# Patient Record
Sex: Male | Born: 1970 | Race: White | Hispanic: No | Smoking: Current every day smoker
Health system: Southern US, Community
[De-identification: ages and names within clinical notes are randomized; demographics above are authoritative.]

## PROBLEM LIST (undated history)

## (undated) DIAGNOSIS — C4491 Basal cell carcinoma of skin, unspecified: Secondary | ICD-10-CM

---

## 2006-04-21 ENCOUNTER — Emergency Department: Payer: Self-pay | Admitting: Emergency Medicine

## 2007-06-03 ENCOUNTER — Emergency Department: Payer: Self-pay | Admitting: Emergency Medicine

## 2007-06-14 ENCOUNTER — Emergency Department: Payer: Self-pay | Admitting: Emergency Medicine

## 2007-06-17 ENCOUNTER — Emergency Department: Payer: Self-pay | Admitting: Emergency Medicine

## 2008-12-08 ENCOUNTER — Emergency Department: Payer: Self-pay

## 2010-10-26 ENCOUNTER — Emergency Department: Payer: Self-pay | Admitting: Emergency Medicine

## 2011-02-25 ENCOUNTER — Emergency Department: Payer: Self-pay

## 2012-07-24 ENCOUNTER — Emergency Department: Payer: Self-pay | Admitting: Emergency Medicine

## 2012-07-24 LAB — DRUG SCREEN, URINE
Amphetamines, Ur Screen: NEGATIVE (ref ?–1000)
Barbiturates, Ur Screen: NEGATIVE (ref ?–200)
Benzodiazepine, Ur Scrn: NEGATIVE (ref ?–200)
Cannabinoid 50 Ng, Ur ~~LOC~~: NEGATIVE (ref ?–50)
Methadone, Ur Screen: NEGATIVE (ref ?–300)
Opiate, Ur Screen: NEGATIVE (ref ?–300)
Tricyclic, Ur Screen: NEGATIVE (ref ?–1000)

## 2012-07-24 LAB — COMPREHENSIVE METABOLIC PANEL
Albumin: 4.3 g/dL (ref 3.4–5.0)
Alkaline Phosphatase: 108 U/L (ref 50–136)
BUN: 15 mg/dL (ref 7–18)
Calcium, Total: 8.7 mg/dL (ref 8.5–10.1)
Co2: 28 mmol/L (ref 21–32)
EGFR (Non-African Amer.): >60
Glucose: 80 mg/dL (ref 65–99)
Osmolality: 275 (ref 275–301)
SGOT(AST): 49 U/L — ABNORMAL HIGH (ref 15–37)
SGPT (ALT): 34 U/L (ref 12–78)
Sodium: 138 mmol/L (ref 136–145)

## 2012-07-24 LAB — CBC
HCT: 39.4 % — ABNORMAL LOW (ref 40.0–52.0)
HGB: 13.5 g/dL (ref 13.0–18.0)
MCH: 30.5 pg (ref 26.0–34.0)
MCHC: 34.3 g/dL (ref 32.0–36.0)
MCV: 89 fL (ref 80–100)
Platelet: 340 10*3/uL (ref 150–440)
RDW: 13.3 % (ref 11.5–14.5)
WBC: 10.4 10*3/uL (ref 3.8–10.6)

## 2012-07-24 LAB — URINALYSIS, COMPLETE
Bacteria: NONE SEEN
Bilirubin,UR: NEGATIVE
Ketone: NEGATIVE
Nitrite: NEGATIVE
Protein: NEGATIVE
RBC,UR: 1 /HPF (ref 0–5)
Specific Gravity: 1.002 (ref 1.003–1.030)
Squamous Epithelial: NONE SEEN
WBC UR: 2 /HPF (ref 0–5)

## 2012-07-24 LAB — ETHANOL
Ethanol %: 0.014 % (ref 0.000–0.080)
Ethanol: 14 mg/dL

## 2013-04-14 ENCOUNTER — Emergency Department: Payer: Self-pay

## 2013-04-14 LAB — BASIC METABOLIC PANEL
Anion Gap: 5 — ABNORMAL LOW (ref 7–16)
Co2: 29 mmol/L (ref 21–32)
Creatinine: 0.75 mg/dL (ref 0.60–1.30)
EGFR (African American): >60
EGFR (Non-African Amer.): >60
Glucose: 91 mg/dL (ref 65–99)
Osmolality: 275 (ref 275–301)
Potassium: 3.9 mmol/L (ref 3.5–5.1)

## 2013-04-14 LAB — TROPONIN I
Troponin-I: <0.02 ng/mL
Troponin-I: <0.02 ng/mL

## 2013-04-14 LAB — CBC
HCT: 38 % — ABNORMAL LOW (ref 40.0–52.0)
HGB: 13.3 g/dL (ref 13.0–18.0)
MCHC: 34.9 g/dL (ref 32.0–36.0)
RBC: 4.32 10*6/uL — ABNORMAL LOW (ref 4.40–5.90)
RDW: 13 % (ref 11.5–14.5)
WBC: 9.6 10*3/uL (ref 3.8–10.6)

## 2014-12-25 ENCOUNTER — Emergency Department
Admit: 2014-12-25 | Disposition: A | Discharge status: Home / Self Care | Payer: Self-pay | Admitting: Emergency Medicine

## 2014-12-25 LAB — COMPREHENSIVE METABOLIC PANEL
ALK PHOS: 64 U/L
ANION GAP: 6 — AB (ref 7–16)
Albumin: 4.2 g/dL
BILIRUBIN TOTAL: 0.3 mg/dL
BUN: 14 mg/dL
CHLORIDE: 101 mmol/L
Calcium, Total: 8.8 mg/dL — ABNORMAL LOW
Co2: 31 mmol/L
Creatinine: 1.2 mg/dL
EGFR (Non-African Amer.): >60
GLUCOSE: 91 mg/dL
POTASSIUM: 3.8 mmol/L
SGOT(AST): 26 U/L
SGPT (ALT): 21 U/L
Sodium: 138 mmol/L
TOTAL PROTEIN: 6.5 g/dL

## 2014-12-25 LAB — CBC WITH DIFFERENTIAL/PLATELET
BASOS PCT: 0.7 %
Basophil #: 0.1 10*3/uL (ref 0.0–0.1)
Eosinophil #: 0.5 10*3/uL (ref 0.0–0.7)
Eosinophil %: 4.4 %
HCT: 41.5 % (ref 40.0–52.0)
HGB: 13.4 g/dL (ref 13.0–18.0)
LYMPHS PCT: 39.5 %
Lymphocyte #: 4.1 10*3/uL — ABNORMAL HIGH (ref 1.0–3.6)
MCH: 29.8 pg (ref 26.0–34.0)
MCHC: 32.3 g/dL (ref 32.0–36.0)
MCV: 92 fL (ref 80–100)
MONOS PCT: 7.9 %
Monocyte #: 0.8 x10 3/mm (ref 0.2–1.0)
Neutrophil #: 4.9 10*3/uL (ref 1.4–6.5)
Neutrophil %: 47.5 %
Platelet: 324 10*3/uL (ref 150–440)
RBC: 4.5 10*6/uL (ref 4.40–5.90)
RDW: 14.2 % (ref 11.5–14.5)
WBC: 10.4 10*3/uL (ref 3.8–10.6)

## 2014-12-25 LAB — LIPASE, BLOOD: LIPASE: 55 U/L — AB

## 2014-12-25 LAB — TROPONIN I

## 2014-12-26 LAB — TROPONIN I: Troponin-I: <0.03 ng/mL

## 2015-03-23 ENCOUNTER — Ambulatory Visit: Payer: Self-pay | Admitting: Internal Medicine

## 2015-05-26 ENCOUNTER — Other Ambulatory Visit: Payer: Self-pay | Admitting: Internal Medicine

## 2015-05-26 DIAGNOSIS — R918 Other nonspecific abnormal finding of lung field: Secondary | ICD-10-CM

## 2015-06-22 ENCOUNTER — Encounter: Payer: Self-pay | Admitting: Emergency Medicine

## 2015-06-22 ENCOUNTER — Emergency Department
Admission: EM | Admit: 2015-06-22 | Discharge: 2015-06-23 | Disposition: A | Discharge status: Home / Self Care | Payer: No Typology Code available for payment source | Attending: Emergency Medicine | Admitting: Emergency Medicine

## 2015-06-22 DIAGNOSIS — F1310 Sedative, hypnotic or anxiolytic abuse, uncomplicated: Secondary | ICD-10-CM | POA: Insufficient documentation

## 2015-06-22 DIAGNOSIS — F1210 Cannabis abuse, uncomplicated: Secondary | ICD-10-CM | POA: Insufficient documentation

## 2015-06-22 DIAGNOSIS — F1110 Opioid abuse, uncomplicated: Secondary | ICD-10-CM | POA: Diagnosis not present

## 2015-06-22 DIAGNOSIS — Z720 Tobacco use: Secondary | ICD-10-CM | POA: Diagnosis not present

## 2015-06-22 DIAGNOSIS — F1410 Cocaine abuse, uncomplicated: Secondary | ICD-10-CM | POA: Diagnosis present

## 2015-06-22 DIAGNOSIS — F1910 Other psychoactive substance abuse, uncomplicated: Secondary | ICD-10-CM

## 2015-06-22 LAB — URINE DRUG SCREEN, QUALITATIVE (ARMC ONLY)
Amphetamines, Ur Screen: NOT DETECTED
BARBITURATES, UR SCREEN: NOT DETECTED
BENZODIAZEPINE, UR SCRN: POSITIVE — AB
Cannabinoid 50 Ng, Ur ~~LOC~~: POSITIVE — AB
Cocaine Metabolite,Ur ~~LOC~~: POSITIVE — AB
MDMA (Ecstasy)Ur Screen: NOT DETECTED
METHADONE SCREEN, URINE: NOT DETECTED
Opiate, Ur Screen: POSITIVE — AB
Phencyclidine (PCP) Ur S: NOT DETECTED
Tricyclic, Ur Screen: NOT DETECTED

## 2015-06-22 LAB — COMPREHENSIVE METABOLIC PANEL
ALT: 24 U/L (ref 17–63)
AST: 34 U/L (ref 15–41)
Albumin: 3.9 g/dL (ref 3.5–5.0)
Alkaline Phosphatase: 78 U/L (ref 38–126)
Anion gap: 5 (ref 5–15)
BUN: 13 mg/dL (ref 6–20)
CHLORIDE: 103 mmol/L (ref 101–111)
CO2: 31 mmol/L (ref 22–32)
Calcium: 9.3 mg/dL (ref 8.9–10.3)
Creatinine, Ser: 0.84 mg/dL (ref 0.61–1.24)
GFR calc Af Amer: >60 mL/min (ref >60)
GFR calc non Af Amer: >60 mL/min (ref >60)
Glucose, Bld: 81 mg/dL (ref 65–99)
Potassium: 4 mmol/L (ref 3.5–5.1)
Sodium: 139 mmol/L (ref 135–145)
TOTAL PROTEIN: 6.4 g/dL — AB (ref 6.5–8.1)
Total Bilirubin: 0.8 mg/dL (ref 0.3–1.2)

## 2015-06-22 LAB — CBC WITH DIFFERENTIAL/PLATELET
BASOS ABS: 0.1 10*3/uL (ref 0–0.1)
BASOS PCT: 1 %
Eosinophils Absolute: 0.2 10*3/uL (ref 0–0.7)
Eosinophils Relative: 3 %
HEMATOCRIT: 40 % (ref 40.0–52.0)
Hemoglobin: 13.2 g/dL (ref 13.0–18.0)
Lymphocytes Relative: 29 %
Lymphs Abs: 2.3 10*3/uL (ref 1.0–3.6)
MCH: 29.9 pg (ref 26.0–34.0)
MCHC: 33 g/dL (ref 32.0–36.0)
MCV: 90.7 fL (ref 80.0–100.0)
MONO ABS: 1 10*3/uL (ref 0.2–1.0)
MONOS PCT: 13 %
NEUTROS ABS: 4.3 10*3/uL (ref 1.4–6.5)
Neutrophils Relative %: 54 %
PLATELETS: 401 10*3/uL (ref 150–440)
RBC: 4.41 MIL/uL (ref 4.40–5.90)
RDW: 13.7 % (ref 11.5–14.5)
WBC: 7.8 10*3/uL (ref 3.8–10.6)

## 2015-06-22 LAB — BLOOD GAS, ARTERIAL
Acid-Base Excess: 4.7 mmol/L — ABNORMAL HIGH (ref 0.0–3.0)
Allens test (pass/fail): POSITIVE — AB
BICARBONATE: 30.9 meq/L — AB (ref 21.0–28.0)
FIO2: 0.21
O2 SAT: 93.4 %
PATIENT TEMPERATURE: 37
pCO2 arterial: 51 mmHg — ABNORMAL HIGH (ref 32.0–48.0)
pH, Arterial: 7.39 (ref 7.350–7.450)
pO2, Arterial: 69 mmHg — ABNORMAL LOW (ref 83.0–108.0)

## 2015-06-22 LAB — ETHANOL

## 2015-06-22 LAB — ACETAMINOPHEN LEVEL

## 2015-06-22 LAB — SALICYLATE LEVEL: Salicylate Lvl: <4 mg/dL (ref 2.8–30.0)

## 2015-06-22 MED ORDER — SODIUM CHLORIDE 0.9 % IV SOLN
1000.0000 mL | INTRAVENOUS | Status: DC
Start: 2015-06-22 — End: 2015-06-23
  Administered 2015-06-22: 1000 mL via INTRAVENOUS

## 2015-06-22 MED ORDER — SODIUM CHLORIDE 0.9 % IV SOLN
1000.0000 mL | Freq: Once | INTRAVENOUS | Status: AC
  Administered 2015-06-22: 1000 mL via INTRAVENOUS

## 2015-06-22 NOTE — ED Notes (Signed)
During I&O cath, pt kicked and most of specimen spilled.  Per MD, UDS takes priority. Specimen walked to lab and told to run UDS first then UA if possible.

## 2015-06-22 NOTE — ED Notes (Signed)
Pt still lethargic, only responding to pain.  No obvious signs of pain noted.  Respirations equal and unlabored, skin warm and dry.

## 2015-06-22 NOTE — ED Notes (Signed)
Pt to ed from EMS under police custody, pt possible took multiple times of unknown medications. Per pt all her took was ativan. Cops states possible cocaine. Pt is alert to pain, if you try to move his arms he resists.

## 2015-06-23 NOTE — ED Provider Notes (Signed)
[facility] Emergency Department Provider Note  ____________________________________________  Time seen: Approximately 1:03 AM  I have reviewed the triage vital signs and the nursing notes.   HISTORY  Chief Complaint Drug Overdose  History limited by somnolence  HPI Edward Hammond is a 44 y.o. male brought to the ED by [facility] police for medical clearance. Police stopped patient for driving under the influence; patient told triage nurse all he took was Ativan. Police suspect possible cocaine as well. Denies trauma. History otherwise unobtainable secondary to patient uncooperative.   History reviewed. No pertinent past medical history.  There are no active problems to display for this patient.   History reviewed. No pertinent past surgical history.  No current outpatient prescriptions on file.  Allergies Review of patient's allergies indicates no known allergies.  History reviewed. No pertinent family history.  Social History Social History  Substance Use Topics  . Smoking status: Current Every Day Smoker  . Smokeless tobacco: None  . Alcohol Use: No    Review of Systems Constitutional: No fever/chills Eyes: No visual changes. ENT: No sore throat. Cardiovascular: Denies chest pain. Respiratory: Denies shortness of breath. Gastrointestinal: No abdominal pain.  No nausea, no vomiting.  No diarrhea.  No constipation. Genitourinary: Negative for dysuria. Musculoskeletal: Negative for back pain. Skin: Negative for rash. Neurological: Negative for headaches, focal weakness or numbness. Psychiatric:Denies intentional ingestion.  Limited by somnolence;10-point ROS otherwise negative.  ____________________________________________   PHYSICAL EXAM:  VITAL SIGNS: ED Triage Vitals  Enc Vitals Group     BP 06/22/15 2154 107/82 mmHg     Pulse Rate 06/22/15 2154 73     Resp 06/22/15 2154 16     Temp 06/22/15 2154 97.5 F (36.4 C)      Temp Source 06/22/15 2154 Oral     SpO2 06/22/15 2154 100 %     Weight 06/22/15 2154 190 lb (86.183 kg)     Height 06/22/15 2154 6\' 2"  (1.88 m)     Head Cir --      Peak Flow --      Pain Score 06/22/15 2303 Asleep     Pain Loc --      Pain Edu? --      Excl. in [place]? --     Constitutional: Somnolent but arousable to voice. Disheveled and in no acute distress. Eyes: Conjunctivae are bloodshot bilaterally. PERRL. EOMI. Head: Atraumatic. Nose: No congestion/rhinnorhea. Mouth/Throat: Mucous membranes are moist.  Oropharynx non-erythematous. Neck: No stridor. No cervical spine tenderness to palpation. Cardiovascular: Normal rate, regular rhythm. Grossly normal heart sounds.  Good peripheral circulation. Respiratory: Normal respiratory effort.  No retractions. Lungs CTAB. Gastrointestinal: Soft and nontender. No distention. No abdominal bruits. No CVA tenderness. Musculoskeletal: No lower extremity tenderness nor edema.  No joint effusions. Neurologic:  Normal speech and language. No gross focal neurologic deficits are appreciated. Moves all extremities 4. Skin:  Skin is warm, dry and intact. No rash noted. Psychiatric: Mood and affect are normal. Speech and behavior are normal.  ____________________________________________   LABS (all labs ordered are listed, but only abnormal results are displayed)  Labs Reviewed  ACETAMINOPHEN LEVEL - Abnormal; Notable for the following:    Acetaminophen (Tylenol), Serum <10 (*)    All other components within normal limits  COMPREHENSIVE METABOLIC PANEL - Abnormal; Notable for the following:    Total Protein 6.4 (*)    All other components within normal limits  URINE DRUG SCREEN, QUALITATIVE ([facility] ONLY) - Abnormal; Notable for the  following:    Cocaine Metabolite,Ur [place] POSITIVE (*)    Opiate, Ur Screen POSITIVE (*)    Cannabinoid 50 Ng, Ur [employer] POSITIVE (*)    Benzodiazepine, Ur Scrn POSITIVE (*)    All other components within normal limits   BLOOD GAS, ARTERIAL - Abnormal; Notable for the following:    pCO2 arterial 51 (*)    pO2, Arterial 69 (*)    Bicarbonate 30.9 (*)    Acid-Base Excess 4.7 (*)    Allens test (pass/fail) POSITIVE (*)    All other components within normal limits  CBC WITH DIFFERENTIAL/PLATELET  ETHANOL  SALICYLATE LEVEL  URINALYSIS COMPLETEWITH MICROSCOPIC ([facility] ONLY)   ____________________________________________  EKG  ED ECG REPORT I, Patrese Neal J, the attending physician, personally viewed and interpreted this ECG.   Date: 06/23/2015  EKG Time: 2221  Rate: 80  Rhythm: normal EKG, normal sinus rhythm  Axis: Normal  Intervals:none  ST&T Change: Nonspecific  ____________________________________________  RADIOLOGY  None ____________________________________________   PROCEDURES  Procedure(s) performed: None  Critical Care performed: No  ____________________________________________   INITIAL IMPRESSION / ASSESSMENT AND PLAN / ED COURSE  Pertinent labs & imaging results that were available during my care of the patient were reviewed by me and considered in my medical decision making (see chart for details).  44 year old male brought under police custody for possible substance ingestion. Laboratory and urinalysis results notable for positive cocaine, opiates, cannabinoids and benzodiazepines. Will initiate IV fluid resuscitation. Patient denies intentional ingestion. Charcoal not medically necessary as we do not know time of patient's ingestion. Will continue to observe patient on the monitor.  ----------------------------------------- 6:10 AM on 06/23/2015 -----------------------------------------  Patient is now awake and ambulatory with steady gait. He will be discharged with the police officer to jail. Strict return precautions given. Patient verbalizes understanding and agrees with plan of care. ____________________________________________   FINAL CLINICAL IMPRESSION(S) / ED  DIAGNOSES  Final diagnoses:  Polysubstance abuse  Cocaine abuse  Marijuana abuse      Paulette Blanch, MD 06/23/15 [phone]

## 2015-06-23 NOTE — ED Notes (Signed)
Pt awake, alert, and oriented.  MD informed

## 2015-06-23 NOTE — Discharge Instructions (Signed)
1. Stop using illicit drugs. 2. Return to the ER for worsening symptoms, persistent vomiting, lethargy or other concerns.  Polysubstance Abuse When people abuse more than one drug or type of drug it is called polysubstance or polydrug abuse. For example, many smokers also drink alcohol. This is one form of polydrug abuse. Polydrug abuse also refers to the use of a drug to counteract an unpleasant effect produced by another drug. It may also be used to help with withdrawal from another drug. People who take stimulants may become agitated. Sometimes this agitation is countered with a tranquilizer. This helps protect against the unpleasant side effects. Polydrug abuse also refers to the use of different drugs at the same time.  Anytime drug use is interfering with normal living activities, it has become abuse. This includes problems with family and friends. Psychological dependence has developed when your mind tells you that the drug is needed. This is usually followed by physical dependence which has developed when continuing increases of drug are required to get the same feeling or "high". This is known as addiction or chemical dependency. A person's risk is much higher if there is a history of chemical dependency in the family. SIGNS OF CHEMICAL DEPENDENCY  You have been told by friends or family that drugs have become a problem.  You fight when using drugs.  You are having blackouts (not remembering what you do while using).  You feel sick from using drugs but continue using.  You lie about use or amounts of drugs (chemicals) used.  You need chemicals to get you going.  You are suffering in work performance or in school because of drug use.  You get sick from use of drugs but continue to use anyway.  You need drugs to relate to people or feel comfortable in social situations.  You use drugs to forget problems. "Yes" answered to any of the above signs of chemical dependency indicates there  are problems. The longer the use of drugs continues, the greater the problems will become. If there is a family history of drug or alcohol use, it is best not to experiment with these drugs. Continual use leads to tolerance. After tolerance develops more of the drug is needed to get the same feeling. This is followed by addiction. With addiction, drugs become the most important part of life. It becomes more important to take drugs than participate in the other usual activities of life. This includes relating to friends and family. Addiction is followed by dependency. Dependency is a condition where drugs are now needed not just to get high, but to feel normal. Addiction cannot be cured but it can be stopped. This often requires outside help and the care of professionals. Treatment centers are listed in the yellow pages under: Cocaine, Narcotics, and Alcoholics Anonymous. Most hospitals and clinics can refer you to a specialized care center. Talk to your caregiver if you need help.   This information is not intended to replace advice given to you by your health care provider. Make sure you discuss any questions you have with your health care provider.   Document Released: 04/17/2005 Document Revised: 11/18/2011 Document Reviewed: 08/31/2014 Elsevier Interactive Patient Education 2016 [employer].  Cannabis Use Disorder Cannabis use disorder is a mental disorder. It is not one-time or occasional use of cannabis, more commonly known as marijuana. Cannabis use disorder is the continued, nonmedical use of cannabis that interferes with normal life activities or causes health problems. People with cannabis use  disorder get a feeling of extreme pleasure and relaxation from cannabis use. This "high" is very rewarding and causes people to use over and over.  The mind-altering ingredient in cannabis is know as THC. THC can also interfere with motor coordination, memory, judgment, and accurate sense of space and  time. These effects can last for a few days after using cannabis. Regular heavy cannabis use can cause long-lasting problems with thinking and learning. In young people, these problems may be permanent. Cannabis sometimes causes severe anxiety, paranoia, or visual hallucinations. Man-made (synthetic) cannabis-like drugs, such as "spice" and "K2," cause the same effects as THC but are much stronger. Cannabis-like drugs can cause dangerously high blood pressure and heart rate.  Cannabis use disorder usually starts in the teenage years. It can trigger the development of schizophrenia. It is somewhat more common in men than women. People who have family members with the disorder or existing mental health issues such as depression and posttraumatic stress disorderare more likely to develop cannabis use disorder. People with cannabis use disorder are at higher risk for use of other drugs of abuse.  SIGNS AND SYMPTOMS Signs and symptoms of cannabis use disorder include:   Use of cannabis in larger amounts or over a longer period than intended.   Unsuccessful attempts to cut down or control cannabis use.   A lot of time spent obtaining, using, or recovering from the effects of cannabis.   A strong desire or urge to use cannabis (cravings).   Continued use of cannabis in spite of problems at work, school, or home because of use.   Continued use of cannabis in spite of relationship problems because of use.  Giving up or cutting down on important life activities because of cannabis use.  Use of cannabis over and over even in situations when it is physically hazardous, such as when driving a car.   Continued use of cannabis in spite of a physical problem that is likely related to use. Physical problems can include:  Chronic cough.  Bronchitis.  Emphysema.  Throat and lung cancer.  Continued use of cannabis in spite of a mental problem that is likely related to use. Mental problems can  include:  Psychosis.  Anxiety.  Difficulty sleeping.  Need to use more and more cannabis to get the same effect, or lessened effect over time with use of the same amount (tolerance).  Having withdrawal symptoms when cannabis use is stopped, or using cannabis to reduce or avoid withdrawal symptoms. Withdrawal symptoms include:  Irritability or anger.  Anxiety or restlessness.  Difficulty sleeping.  Loss of appetite or weight.  Aches and pains.  Shakiness.  Sweating.  Chills. DIAGNOSIS Cannabis use disorder is diagnosed by your health care provider. You may be asked questions about your cannabis use and how it affects your life. A physical exam may be done. A drug screen may be done. You may be referred to a mental health professional. The diagnosis of cannabis use disorder requires at least two symptoms within 12 months. The type of cannabis use disorder you have depends on the number of symptoms you have. The type may be:  Mild. Two or three signs and symptoms.   Moderate. Four or five signs and symptoms.   Severe. Six or more signs and symptoms.  TREATMENT Treatment is usually provided by mental health professionals with training in substance use disorders. The following options are available:  Counseling or talk therapy. Talk therapy addresses the reasons you use cannabis.  It also addresses ways to keep you from using again. The goals of talk therapy include:  Identifying and avoiding triggers for use.  Learning how to handle cravings.  Replacing use with healthy activities.  Support groups. Support groups provide emotional support, advice, and guidance.  Medicine. Medicine is used to treat mental health issues that trigger cannabis use or that result from it. HOME CARE INSTRUCTIONS  Take medicines only as directed by your health care provider.  Check with your health care provider before starting any new medicines.  Keep all follow-up visits as directed by  your health care provider. SEEK MEDICAL CARE IF:  You are not able to take your medicines as directed.  Your symptoms get worse. SEEK IMMEDIATE MEDICAL CARE IF: You have serious thoughts about hurting yourself or others. [place] on Drug Abuse: motorcyclefax.com  Substance Abuse and Mental Health Services Administration: ktimeonline.com   This information is not intended to replace advice given to you by your health care provider. Make sure you discuss any questions you have with your health care provider.   Document Released: 08/23/2000 Document Revised: 09/16/2014 Document Reviewed: 09/08/2013 Elsevier Interactive Patient Education 2016 [employer].  Stimulant Use Disorder-Cocaine Cocaine is one of a group of powerful drugs called stimulants. Cocaine has medical uses for stopping nosebleeds and for pain control before minor nose or dental surgery. However, cocaine is misused because of the effects that it produces. These effects include:   A feeling of extreme pleasure.  Alertness.  High energy. Common street names for cocaine include coke, crack, blow, snow, and nose candy. Cocaine is snorted, dissolved in water and injected, or smoked.  Stimulants are addictive because they activate regions of the brain that produce both the pleasurable sensation of "reward" and psychological dependence. Together, these actions account for loss of control and the rapid development of drug dependence. This means you become ill without the drug (withdrawal) and need to keep using it to function.  Stimulant use disorder is use of stimulants that disrupts your daily life. It disrupts relationships with family and friends and how you do your job. Cocaine increases your blood pressure and heart rate. It can cause a heart attack or stroke. Cocaine can also cause death from irregular heart rate or seizures. SYMPTOMS Symptoms of stimulant use disorder with cocaine  include:  Use of cocaine in larger amounts or over a longer period of time than intended.  Unsuccessful attempts to cut down or control cocaine use.  A lot of time spent obtaining, using, or recovering from the effects of cocaine.  A strong desire or urge to use cocaine (craving).  Continued use of cocaine in spite of major problems at work, school, or home because of use.  Continued use of cocaine in spite of relationship problems because of use.  Giving up or cutting down on important life activities because of cocaine use.  Use of cocaine over and over in situations when it is physically hazardous, such as driving a car.  Continued use of cocaine in spite of a physical problem that is likely related to use. Physical problems can include:  Malnutrition.  Nosebleeds.  Chest pain.  High blood pressure.  A hole that develops between the part of your nose that separates your nostrils (perforated nasal septum).  Lung and kidney damage.  Continued use of cocaine in spite of a mental problem that is likely related to use. Mental problems can include:  Schizophrenia-like symptoms.  Depression.  Bipolar mood swings.  Anxiety.  Sleep problems.  Need to use more and more cocaine to get the same effect, or lessened effect over time with use of the same amount of cocaine (tolerance).  Having withdrawal symptoms when cocaine use is stopped, or using cocaine to reduce or avoid withdrawal symptoms. Withdrawal symptoms include:  Depressed or irritable mood.  Low energy or restlessness.  Bad dreams.  Poor or excessive sleep.  Increased appetite. DIAGNOSIS Stimulant use disorder is diagnosed by your health care provider. You may be asked questions about your cocaine use and how it affects your life. A physical exam may be done. A drug screen may be ordered. You may be referred to a mental health professional. The diagnosis of stimulant use disorder requires at least two  symptoms within 12 months. The type of stimulant use disorder depends on the number of signs and symptoms you have. The type may be:  Mild. Two or three signs and symptoms.  Moderate. Four or five signs and symptoms.  Severe. Six or more signs and symptoms. TREATMENT Treatment for stimulant use disorder is usually provided by mental health professionals with training in substance use disorders. The following options are available:  Counseling or talk therapy. Talk therapy addresses the reasons you use cocaine and ways to keep you from using again. Goals of talk therapy include:  Identifying and avoiding triggers for use.  Handling cravings.  Replacing use with healthy activities.  Support groups. Support groups provide emotional support, advice, and guidance.  Medicine. Certain medicines may decrease cocaine cravings or withdrawal symptoms. HOME CARE INSTRUCTIONS  Take medicines only as directed by your health care provider.  Identify the people and activities that trigger your cocaine use and avoid them.  Keep all follow-up visits as directed by your health care provider. SEEK MEDICAL CARE IF:  Your symptoms get worse or you relapse.  You are not able to take medicines as directed. SEEK IMMEDIATE MEDICAL CARE IF:  You have serious thoughts about hurting yourself or others.  You have a seizure, chest pain, sudden weakness, or loss of speech or vision. [place] on Drug Abuse: motorcyclefax.com  Substance Abuse and Mental Health Services Administration: ktimeonline.com   This information is not intended to replace advice given to you by your health care provider. Make sure you discuss any questions you have with your health care provider.   Document Released: 08/23/2000 Document Revised: 09/16/2014 Document Reviewed: 09/08/2013 Elsevier Interactive Patient Education [employer].

## 2015-08-24 ENCOUNTER — Other Ambulatory Visit: Payer: Self-pay | Admitting: Physician Assistant

## 2015-08-24 ENCOUNTER — Ambulatory Visit
Admission: RE | Admit: 2015-08-24 | Discharge: 2015-08-24 | Disposition: A | Discharge status: Home / Self Care | Payer: No Typology Code available for payment source | Source: Ambulatory Visit | Attending: Physician Assistant | Admitting: Physician Assistant

## 2015-08-24 ENCOUNTER — Ambulatory Visit
Admission: RE | Admit: 2015-08-24 | Discharge: 2015-08-24 | Disposition: A | Discharge status: Home / Self Care | Payer: No Typology Code available for payment source | Source: Ambulatory Visit | Attending: Internal Medicine | Admitting: Internal Medicine

## 2015-08-24 DIAGNOSIS — R600 Localized edema: Secondary | ICD-10-CM | POA: Insufficient documentation

## 2015-08-24 DIAGNOSIS — R918 Other nonspecific abnormal finding of lung field: Secondary | ICD-10-CM | POA: Diagnosis not present

## 2015-08-24 HISTORY — DX: Basal cell carcinoma of skin, unspecified: C44.91

## 2015-08-24 MED ORDER — IOHEXOL 350 MG/ML SOLN
75.0000 mL | Freq: Once | INTRAVENOUS | Status: AC | PRN
  Administered 2015-08-24: 75 mL via INTRAVENOUS

## 2017-07-21 ENCOUNTER — Other Ambulatory Visit: Payer: Self-pay | Admitting: Internal Medicine

## 2017-07-21 DIAGNOSIS — R9389 Abnormal findings on diagnostic imaging of other specified body structures: Secondary | ICD-10-CM

## 2017-08-11 ENCOUNTER — Encounter: Payer: Self-pay | Admitting: Emergency Medicine

## 2017-08-11 ENCOUNTER — Other Ambulatory Visit: Payer: Self-pay

## 2017-08-11 ENCOUNTER — Emergency Department: Payer: No Typology Code available for payment source

## 2017-08-11 ENCOUNTER — Emergency Department
Admission: EM | Admit: 2017-08-11 | Discharge: 2017-08-11 | Disposition: A | Discharge status: Home / Self Care | Payer: No Typology Code available for payment source | Attending: Emergency Medicine | Admitting: Emergency Medicine

## 2017-08-11 DIAGNOSIS — F17210 Nicotine dependence, cigarettes, uncomplicated: Secondary | ICD-10-CM | POA: Insufficient documentation

## 2017-08-11 DIAGNOSIS — R079 Chest pain, unspecified: Secondary | ICD-10-CM | POA: Insufficient documentation

## 2017-08-11 DIAGNOSIS — Z85828 Personal history of other malignant neoplasm of skin: Secondary | ICD-10-CM | POA: Insufficient documentation

## 2017-08-11 LAB — CBC
HEMATOCRIT: 42.8 % (ref 40.0–52.0)
Hemoglobin: 14.3 g/dL (ref 13.0–18.0)
MCH: 30.9 pg (ref 26.0–34.0)
MCHC: 33.5 g/dL (ref 32.0–36.0)
MCV: 92.1 fL (ref 80.0–100.0)
Platelets: 356 10*3/uL (ref 150–440)
RBC: 4.65 MIL/uL (ref 4.40–5.90)
RDW: 13.4 % (ref 11.5–14.5)
WBC: 9.6 10*3/uL (ref 3.8–10.6)

## 2017-08-11 LAB — TROPONIN I: Troponin I: <0.03 ng/mL (ref <0.03)

## 2017-08-11 LAB — BASIC METABOLIC PANEL
Anion gap: 12 (ref 5–15)
BUN: 11 mg/dL (ref 6–20)
CHLORIDE: 101 mmol/L (ref 101–111)
CO2: 25 mmol/L (ref 22–32)
Calcium: 9 mg/dL (ref 8.9–10.3)
Creatinine, Ser: 0.8 mg/dL (ref 0.61–1.24)
GFR calc Af Amer: >60 mL/min (ref >60)
GFR calc non Af Amer: >60 mL/min (ref >60)
GLUCOSE: 134 mg/dL — AB (ref 65–99)
POTASSIUM: 3.7 mmol/L (ref 3.5–5.1)
Sodium: 138 mmol/L (ref 135–145)

## 2017-08-11 NOTE — ED Provider Notes (Signed)
[facility] Emergency Department Provider Note  ____________________________________________   First MD Initiated Contact with Patient 08/11/17 1649     (approximate)  I have reviewed the triage vital signs and the nursing notes.   HISTORY  Chief Complaint Chest Pain   HPI Edward Hammond is a 46 y.o. male a history of cocaine abuse who is presenting to the emergency department today with left-sided chest pain.  He says that he is pain-free at this time but had constant squeezing chest pain to the left side of his chest as well as pain to his distal left arm over the past 2 days.  He says that there were no worsening of the symptoms with exertion and that the symptoms could be just as bad when sitting as with activity.  He is denying any nausea, vomiting or shortness of breath.  Said that he also had some numbness to his posterior shoulder.  He says that he has had the same type of pain previously but without the shoulder numbness.  He says that he has not done cocaine in 2 months but continues to smoke cigarettes.  Does not report any cardiac disease in his family.    Past Medical History:  Diagnosis Date  . Basal cell carcinoma of skin    Skin Cancer in [place] resected from back over 20 years ago.    There are no active problems to display for this patient.   No past surgical history on file.  Prior to Admission medications   Not on File    Allergies Toradol [ketorolac tromethamine]  No family history on file.  Social History Social History   Tobacco Use  . Smoking status: Current Every Day Smoker  Substance Use Topics  . Alcohol use: No  . Drug use: Yes    Types: Cocaine    Review of Systems  Constitutional: No fever/chills Eyes: No visual changes. ENT: No sore throat. Cardiovascular: As above  respiratory: Denies shortness of breath. Gastrointestinal: No abdominal pain.  No nausea, no vomiting.  No diarrhea.  No  constipation. Genitourinary: Negative for dysuria. Musculoskeletal: Negative for back pain. Skin: Negative for rash. Neurological: Negative for headaches, focal weakness or numbness.   ____________________________________________   PHYSICAL EXAM:  VITAL SIGNS: ED Triage Vitals  Enc Vitals Group     BP 08/11/17 1535 134/86     Pulse Rate 08/11/17 1535 78     Resp 08/11/17 1535 18     Temp 08/11/17 1535 98 F (36.7 C)     Temp Source 08/11/17 1535 Oral     SpO2 08/11/17 1535 100 %     Weight 08/11/17 1535 181 lb (82.1 kg)     Height 08/11/17 1535 6\' 2"  (1.88 m)     Head Circumference --      Peak Flow --      Pain Score 08/11/17 1534 3     Pain Loc --      Pain Edu? --      Excl. in [place]? --     Constitutional: Alert and oriented. Well appearing and in no acute distress. Eyes: Conjunctivae are normal.  Head: Atraumatic. Nose: No congestion/rhinnorhea. Mouth/Throat: Mucous membranes are moist.  Neck: No stridor.   Cardiovascular: Normal rate, regular rhythm. Grossly normal heart sounds.  Good peripheral circulation with equal and bilateral radial pulses. Respiratory: Normal respiratory effort.  No retractions. Lungs CTAB. Gastrointestinal: Soft and nontender. No distention.  Musculoskeletal: No lower extremity tenderness nor edema.  No joint effusions. Neurologic:  Normal speech and language. No gross focal neurologic deficits are appreciated. Skin:  Skin is warm, dry and intact. No rash noted. Psychiatric: Mood and affect are normal. Speech and behavior are normal.  ____________________________________________   LABS (all labs ordered are listed, but only abnormal results are displayed)  Labs Reviewed  BASIC METABOLIC PANEL - Abnormal; Notable for the following components:      Result Value   Glucose, Bld 134 (*)    All other components within normal limits  CBC  TROPONIN I   ____________________________________________  EKG  ED ECG REPORT I, Doran Stabler, the attending physician, personally viewed and interpreted this ECG.   Date: 08/11/2017  EKG Time: 1533  Rate: 78  Rhythm: normal sinus rhythm  Axis: Normal  Intervals:none  ST&T Change: No ST segment elevation or depression.  No abnormal T wave inversion.  Findings consistent with LVH. No significant change from previous EKGs in the record.    ____________________________________________  RADIOLOGY  No active disease ____________________________________________   PROCEDURES  Procedure(s) performed:   Procedures  Critical Care performed:   ____________________________________________   INITIAL IMPRESSION / ASSESSMENT AND PLAN / ED COURSE  Pertinent labs & imaging results that were available during my care of the patient were reviewed by me and considered in my medical decision making (see chart for details).  Differential diagnosis includes, but is not limited to, ACS, aortic dissection, pulmonary embolism, cardiac tamponade, pneumothorax, pneumonia, pericarditis, myocarditis, GI-related causes including esophagitis/gastritis, and musculoskeletal chest wall pain.    As part of my medical decision making, I reviewed the following data within the electronic MEDICAL RECORD NUMBER Notes from prior ED visits  Heart score of 3. PERC negative.  Patient symptom-free at this time.  He says that he also took 325 mg of aspirin several hours ago.  The patient will continue on 81 mg daily and will follow up with cardiology.  He knows to follow-up within the next 1-2 days and will return to the emergency department immediately for any worsening or concerning symptoms.  He is understanding of this plan willing to comply.        ____________________________________________   FINAL CLINICAL IMPRESSION(S) / ED DIAGNOSES  Chest pain    NEW MEDICATIONS STARTED DURING THIS VISIT:  This SmartLink is deprecated. Use AVSMEDLIST instead to display the medication list for a  patient.   Note:  This document was prepared using Dragon voice recognition software and may include unintentional dictation errors.    Orbie Pyo, MD 08/11/17 [phone]

## 2017-08-11 NOTE — ED Triage Notes (Signed)
Pt reports left side chest pain radiating to left arm for two days. Pt reports associated dizziness. Pt ambulatory to triage. No apparent distress noted.

## 2017-09-21 DIAGNOSIS — R079 Chest pain, unspecified: Secondary | ICD-10-CM | POA: Insufficient documentation

## 2017-09-21 DIAGNOSIS — F17200 Nicotine dependence, unspecified, uncomplicated: Secondary | ICD-10-CM | POA: Insufficient documentation

## 2017-09-21 DIAGNOSIS — J449 Chronic obstructive pulmonary disease, unspecified: Secondary | ICD-10-CM | POA: Insufficient documentation

## 2017-09-21 DIAGNOSIS — F1990 Other psychoactive substance use, unspecified, uncomplicated: Secondary | ICD-10-CM | POA: Insufficient documentation

## 2017-09-21 DIAGNOSIS — F329 Major depressive disorder, single episode, unspecified: Secondary | ICD-10-CM | POA: Insufficient documentation

## 2017-09-21 DIAGNOSIS — F988 Other specified behavioral and emotional disorders with onset usually occurring in childhood and adolescence: Secondary | ICD-10-CM | POA: Insufficient documentation

## 2017-09-21 DIAGNOSIS — F1411 Cocaine abuse, in remission: Secondary | ICD-10-CM | POA: Insufficient documentation

## 2017-09-21 DIAGNOSIS — F32A Depression, unspecified: Secondary | ICD-10-CM | POA: Insufficient documentation

## 2017-09-21 NOTE — Progress Notes (Deleted)
Cardiology Office Note  Date:  09/21/2017   ID:  Edward Hammond, DOB 1970-11-25, MRN [id number]  PCP:  Kirk Ruths, MD   No chief complaint on file.   HPI:   Smoker COPD ADD, depression Cocaine abuse Referred by emergency room for symptoms of chest pain  Seen in the emergency room August 11, 2017 Reported having constant squeezing chest pain to the left side of his chest as well as pain to his distal left arm over the past 2 days.    numbness to his posterior shoulder.    same type of pain previously but without the shoulder numbness.   He says that he has not done cocaine in 2 months but continues to smoke cigarettes.   Does not report any cardiac disease in his family.  Previous tox screen positive October 2016 for cocaine, marijuana, opiates, benzodiazepines   PMH:   has a past medical history of Basal cell carcinoma of skin.  PSH:   No past surgical history on file.  No current outpatient medications on file.   No current facility-administered medications for this visit.      Allergies:   Toradol [ketorolac tromethamine]   Social History:  The patient  reports that he has been smoking.  He does not have any smokeless tobacco history on file. He reports that he uses drugs. Drug: Cocaine. He reports that he does not drink alcohol.   Family History:   family history is not on file.    Review of Systems: ROS   PHYSICAL EXAM: VS:  There were no vitals taken for this visit. , BMI There is no height or weight on file to calculate BMI. GEN: Well nourished, well developed, in no acute distress HEENT: normal Neck: no JVD, carotid bruits, or masses Cardiac: RRR; no murmurs, rubs, or gallops,no edema  Respiratory:  clear to auscultation bilaterally, normal work of breathing GI: soft, nontender, nondistended, + BS MS: no deformity or atrophy Skin: warm and dry, no rash Neuro:  Strength and sensation are intact Psych: euthymic mood, full  affect    Recent Labs: 08/11/2017: BUN 11; Creatinine, Ser 0.80; Hemoglobin 14.3; Platelets 356; Potassium 3.7; Sodium 138    Lipid Panel No results found for: CHOL, HDL, LDLCALC, TRIG    Wt Readings from Last 3 Encounters:  08/11/17 181 lb (82.1 kg)  06/22/15 190 lb (86.2 kg)       ASSESSMENT AND PLAN:  No diagnosis found.   Disposition:   F/U  6 months  No orders of the defined types were placed in this encounter.    Signed, Esmond Plants, M.D., Ph.D. 09/21/2017  [place], [place]

## 2017-09-24 ENCOUNTER — Ambulatory Visit: Payer: No Typology Code available for payment source | Admitting: Cardiovascular Disease

## 2017-10-22 ENCOUNTER — Ambulatory Visit: Payer: No Typology Code available for payment source | Admitting: Internal Medicine

## 2017-10-23 ENCOUNTER — Encounter: Payer: Self-pay | Admitting: Internal Medicine

## 2018-04-03 ENCOUNTER — Other Ambulatory Visit: Payer: Self-pay | Admitting: Internal Medicine

## 2018-04-03 DIAGNOSIS — R911 Solitary pulmonary nodule: Secondary | ICD-10-CM

## 2018-04-08 ENCOUNTER — Ambulatory Visit
Admission: RE | Admit: 2018-04-08 | Discharge: 2018-04-08 | Disposition: A | Discharge status: Home / Self Care | Payer: Self-pay | Source: Ambulatory Visit | Attending: Internal Medicine | Admitting: Internal Medicine

## 2018-04-08 DIAGNOSIS — R911 Solitary pulmonary nodule: Secondary | ICD-10-CM | POA: Insufficient documentation

## 2019-03-09 ENCOUNTER — Other Ambulatory Visit: Payer: Self-pay

## 2019-03-09 DIAGNOSIS — R0789 Other chest pain: Secondary | ICD-10-CM | POA: Insufficient documentation

## 2019-03-09 DIAGNOSIS — F17210 Nicotine dependence, cigarettes, uncomplicated: Secondary | ICD-10-CM | POA: Insufficient documentation

## 2019-03-09 DIAGNOSIS — C4491 Basal cell carcinoma of skin, unspecified: Secondary | ICD-10-CM | POA: Insufficient documentation

## 2019-03-09 DIAGNOSIS — J449 Chronic obstructive pulmonary disease, unspecified: Secondary | ICD-10-CM | POA: Insufficient documentation

## 2019-03-09 LAB — COMPREHENSIVE METABOLIC PANEL
ALT: 73 U/L — ABNORMAL HIGH (ref 0–44)
AST: 50 U/L — ABNORMAL HIGH (ref 15–41)
Albumin: 3.7 g/dL (ref 3.5–5.0)
Alkaline Phosphatase: 81 U/L (ref 38–126)
Anion gap: 7 (ref 5–15)
BUN: 8 mg/dL (ref 6–20)
CO2: 28 mmol/L (ref 22–32)
Calcium: 8.6 mg/dL — ABNORMAL LOW (ref 8.9–10.3)
Chloride: 104 mmol/L (ref 98–111)
Creatinine, Ser: 0.71 mg/dL (ref 0.61–1.24)
GFR calc Af Amer: >60 mL/min (ref >60)
GFR calc non Af Amer: >60 mL/min (ref >60)
Glucose, Bld: 112 mg/dL — ABNORMAL HIGH (ref 70–99)
Potassium: 4 mmol/L (ref 3.5–5.1)
Sodium: 139 mmol/L (ref 135–145)
Total Bilirubin: 0.3 mg/dL (ref 0.3–1.2)
Total Protein: 6.4 g/dL — ABNORMAL LOW (ref 6.5–8.1)

## 2019-03-09 LAB — CBC
HCT: 42.1 % (ref 39.0–52.0)
Hemoglobin: 13.7 g/dL (ref 13.0–17.0)
MCH: 29.7 pg (ref 26.0–34.0)
MCHC: 32.5 g/dL (ref 30.0–36.0)
MCV: 91.1 fL (ref 80.0–100.0)
Platelets: 411 10*3/uL — ABNORMAL HIGH (ref 150–400)
RBC: 4.62 MIL/uL (ref 4.22–5.81)
RDW: 12.9 % (ref 11.5–15.5)
WBC: 12.6 10*3/uL — ABNORMAL HIGH (ref 4.0–10.5)
nRBC: 0 % (ref 0.0–0.2)

## 2019-03-09 LAB — TROPONIN I (HIGH SENSITIVITY): Troponin I (High Sensitivity): <2 ng/L (ref <18)

## 2019-03-09 NOTE — ED Triage Notes (Signed)
Pt in with co left sided chest pain x 2-3 days hx of the same but states no hx of heart disease. States does feel shob at times, no fever.

## 2019-03-10 ENCOUNTER — Emergency Department
Admission: EM | Admit: 2019-03-10 | Discharge: 2019-03-10 | Disposition: A | Discharge status: Home / Self Care | Payer: Self-pay | Attending: Emergency Medicine | Admitting: Emergency Medicine

## 2019-03-10 ENCOUNTER — Emergency Department: Payer: Self-pay

## 2019-03-10 DIAGNOSIS — R079 Chest pain, unspecified: Secondary | ICD-10-CM

## 2019-03-10 LAB — TROPONIN I (HIGH SENSITIVITY): Troponin I (High Sensitivity): <2 ng/L (ref <18)

## 2019-03-10 MED ORDER — IOHEXOL 350 MG/ML SOLN
75.0000 mL | Freq: Once | INTRAVENOUS | Status: AC | PRN
  Administered 2019-03-10: 75 mL via INTRAVENOUS

## 2019-03-10 NOTE — ED Provider Notes (Signed)
[facility] Emergency Department Provider Note   First MD Initiated Contact with Patient 03/10/19 [phone]     (approximate)  I have reviewed the triage vital signs and the nursing notes.   HISTORY  Chief Complaint Chest Pain   HPI Edward Hammond is a 48 y.o. male with below list of previous medical conditions presents to the emergency department secondary to  3-day history of nonreproducible nonradiating left-sided chest pain.  Patient states that current pain score is 4 out of 10.  Patient denies any dyspnea no nausea vomiting.  Patient denies any diaphoresis.  Patient denies any cough or fever.     Past Medical History:  Diagnosis Date  . Basal cell carcinoma of skin    Skin Cancer in [place] resected from back over 20 years ago.    Patient Active Problem List   Diagnosis Date Noted  . Chest pain with moderate risk for cardiac etiology 09/21/2017  . History of cocaine abuse ([place]) 09/21/2017  . Smoker 09/21/2017  . COPD (chronic obstructive pulmonary disease) ([place]) 09/21/2017  . ADD (attention deficit disorder) 09/21/2017  . Depression 09/21/2017    No past surgical history on file.  Prior to Admission medications   Not on File    Allergies Toradol [ketorolac tromethamine]  No family history on file.  Social History Social History   Tobacco Use  . Smoking status: Current Every Day Smoker  Substance Use Topics  . Alcohol use: No  . Drug use: Yes    Types: Cocaine    Review of Systems Constitutional: No fever/chills Eyes: No visual changes. ENT: No sore throat. Cardiovascular: Positive for chest pain. Respiratory: Denies shortness of breath. Gastrointestinal: No abdominal pain.  No nausea, no vomiting.  No diarrhea.  No constipation. Genitourinary: Negative for dysuria. Musculoskeletal: Negative for neck pain.  Negative for back pain. Integumentary: Negative for rash. Neurological: Negative for headaches, focal weakness or  numbness.   ____________________________________________   PHYSICAL EXAM:  VITAL SIGNS: ED Triage Vitals  Enc Vitals Group     BP 03/09/19 2242 128/89     Pulse Rate 03/09/19 2242 76     Resp 03/09/19 2242 20     Temp 03/09/19 2242 98.7 F (37.1 C)     Temp Source 03/09/19 2242 Oral     SpO2 03/09/19 2242 98 %     Weight 03/09/19 2241 95.3 kg (210 lb)     Height 03/09/19 2241 1.88 m (6\' 2" )     Head Circumference --      Peak Flow --      Pain Score 03/09/19 2240 4     Pain Loc --      Pain Edu? --      Excl. in [place]? --     Constitutional: Alert and oriented. Well appearing and in no acute distress. Eyes: Conjunctivae are normal.  Mouth/Throat: Mucous membranes are moist. Oropharynx non-erythematous. Neck: No stridor.   Cardiovascular: Normal rate, regular rhythm. Good peripheral circulation. Grossly normal heart sounds. Respiratory: Normal respiratory effort.  No retractions. No audible wheezing. Gastrointestinal: Soft and nontender. No distention.  Musculoskeletal: No lower extremity tenderness nor edema. No gross deformities of extremities. Neurologic:  Normal speech and language. No gross focal neurologic deficits are appreciated.  Skin:  Skin is warm, dry and intact. No rash noted. Psychiatric: Mood and affect are normal. Speech and behavior are normal.  ____________________________________________   LABS (all labs ordered are listed, but only abnormal results are displayed)  Labs Reviewed  CBC - Abnormal; Notable for the following components:      Result Value   WBC 12.6 (*)    Platelets 411 (*)    All other components within normal limits  COMPREHENSIVE METABOLIC PANEL - Abnormal; Notable for the following components:   Glucose, Bld 112 (*)    Calcium 8.6 (*)    Total Protein 6.4 (*)    AST 50 (*)    ALT 73 (*)    All other components within normal limits  TROPONIN I (HIGH SENSITIVITY)  TROPONIN I (HIGH SENSITIVITY)    ____________________________________________  EKG  ED ECG REPORT I, [place] N , the attending physician, personally viewed and interpreted this ECG.   Date: 03/09/2019  EKG Time: 10:38 PM  Rate: 74  Rhythm: Normal sinus rhythm  Axis: Normal  Intervals: Normal  ST&T Change: None  ____________________________________________  RADIOLOGY I, [place] N , personally viewed and evaluated these images (plain radiographs) as part of my medical decision making, as well as reviewing the written report by the radiologist.  ED MD interpretation:  Negative for PE  Official radiology report(s): Ct Angio Chest Pe W And/or Wo Contrast  Result Date: 03/10/2019 CLINICAL DATA:  Left-sided chest pain EXAM: CT ANGIOGRAPHY CHEST WITH CONTRAST TECHNIQUE: Multidetector CT imaging of the chest was performed using the standard protocol during bolus administration of intravenous contrast. Multiplanar CT image reconstructions and MIPs were obtained to evaluate the vascular anatomy. CONTRAST:  36mL OMNIPAQUE IOHEXOL 350 MG/ML SOLN COMPARISON:  04/08/2018 FINDINGS: Cardiovascular: Normal heart size. No pericardial effusion. Suboptimal but satisfactory opacification of the pulmonary arteries to the segmental level. No pulmonary artery filling defect. Mediastinum/Nodes: Negative for mass or worrisome lymph node. Prominent right hilar lymph nodes remain stable. Lungs/Pleura: Central airways are clear. Borderline generalized airway thickening. There is a 5 x 10 mm sub solid nodule in the left upper lobe which is increased in density since the pure ground-glass appearance in 2016. A sub solid nodule in the left upper lobe on 7:52 is also stable at 4 years. There are a few solid nodules that are smaller elsewhere in the lungs, stable. There is no edema, consolidation, effusion, or pneumothorax. Upper Abdomen: Negative Musculoskeletal: No acute or aggressive finding Review of the MIP images confirms the above  findings. IMPRESSION: 1. Negative for pulmonary embolism or other acute finding. 2. 5 x 10 mm left upper lobe sub solid nodule. The nodule has become more solid since 2016, although size is similar. Recommend six-month follow-up chest CT without contrast. Electronically Signed   By: Monte Fantasia M.D.   On: 03/10/2019 04:36      Procedures   ____________________________________________   INITIAL IMPRESSION / MDM / ASSESSMENT AND PLAN / ED COURSE  As part of my medical decision making, I reviewed the following data within the electronic MEDICAL RECORD NUMBER  48 year old male presented with above-stated history and physical exam secondary to chest pain.  He said possibly of ACS however a EKG revealed no evidence of ischemia or infarction.  High-sensitivity troponin negative x2.  Also considered possibly pulmonary emboli however CT angiogram of the chest revealed no evidence of pulmonary emboli or any evidence of aortic aneurysm.  Patient be referred to audiology for further outpatient evaluation.  *Edward Hammond was evaluated in Emergency Department on 03/10/2019 for the symptoms described in the history of present illness. He was evaluated in the context of the global COVID-19 pandemic, which necessitated consideration that the patient might be at  risk for infection with the SARS-CoV-2 virus that causes COVID-19. Institutional protocols and algorithms that pertain to the evaluation of patients at risk for COVID-19 are in a state of rapid change based on information released by regulatory bodies including the CDC and federal and state organizations. These policies and algorithms were followed during the patient's care in the ED.  Some ED evaluations and interventions may be delayed as a result of limited staffing during the pandemic.*        ____________________________________________  FINAL CLINICAL IMPRESSION(S) / ED DIAGNOSES  Final diagnoses:  Chest pain, unspecified type      MEDICATIONS GIVEN DURING THIS VISIT:  Medications  iohexol (OMNIPAQUE) 350 MG/ML injection 75 mL (75 mLs Intravenous Contrast Given 03/10/19 0357)     ED Discharge Orders    None       Note:  This document was prepared using Dragon voice recognition software and may include unintentional dictation errors.   Gregor Hams, MD 03/11/19 [phone]

## 2019-03-22 ENCOUNTER — Telehealth: Payer: Self-pay | Admitting: Emergency Medicine

## 2019-03-22 NOTE — Telephone Encounter (Signed)
Called patient to assure he is aware he should have CT scan chest in 6 months per result recommendation.  I left a message asking him to call me.

## 2019-04-22 ENCOUNTER — Other Ambulatory Visit: Payer: Self-pay | Admitting: Oncology

## 2019-04-22 DIAGNOSIS — R911 Solitary pulmonary nodule: Secondary | ICD-10-CM

## 2019-04-22 NOTE — Progress Notes (Signed)
  Pulmonary Nodule Clinic Telephone Note  Received referral from [facility] ED.   Per most recent guidelines and recommendations from [place] (2017), this patient requires a CT w/o contrast in 6 months from last scan and follow-up visit in the pulmonary nodule.    Patient initially presented to the emergency room on 03/10/19 for complaints of nonreproducible nonradiating left-sided chest pain.  Subsequent work-up included an EKG, lab work and a PE work-up including a CT angiogram.  CTA completed  revealed a 5 x 10 mm left upper lobe sub-solid nodule that appeared larger and had become more solid since 2016.  Other comparable imaging included a  CT chest completed on 04/08/18 showing stable right lung nodules 4 mm or less in size similar to CT scan from 2016.  CT chest completed and 2016 revealed tiny indeterminate right lung nodules measuring up to 4 mm in size. Per Fleischner guidelines (2017), CT scan without contrast is recommended in 6 months.   High risk factors include: History of heavy smoking, exposure to asbestos, radium or uranium, personal family history of lung cancer, older age, sex (females greater than males), race (black and native [place] greater than weight), marginal speculation, upper lobe location, multiplicity (less than 5 nodules increases risk for malignancy) and emphysema and/or pulmonary fibrosis.   Patient is a current everyday smoker.  Will reach out to PCP Dr. Ouida Sills.   This recommendation follows the consensus statement: Guidelines for Management of Incidental Pulmonary Nodules Detected on CT Images: From the Fleischner Society 2017; Radiology 2017; 284:228-243.    I have placed order for CT scan without contrast to be completed approximately 6 months from previous CT scan.  I would like him to see me in our Pulmonary Nodule Clinic after imaging.   Scheduling has been notified of appointment request and will call patient with appointment   Faythe Casa, NP  10/21/2018 2:22 PM

## 2019-05-07 ENCOUNTER — Telehealth: Payer: Self-pay | Admitting: *Deleted

## 2019-05-07 NOTE — Telephone Encounter (Signed)
Attempted to call pt to discuss referral to lung nodule clinic and review recommendations for upcoming appts for follow up CT scan and follow up with Rulon Abide, NP. Pt answered the phone and hung up the line. Will attempt to call again at a later time.

## 2019-06-29 ENCOUNTER — Telehealth: Payer: Self-pay | Admitting: *Deleted

## 2019-06-29 NOTE — Telephone Encounter (Signed)
Referral received for pt to be seen in Lung Nodule Clinic for further workup of incidental lung nodule with follow up CT scan. Left message with patient to call back to discuss clinic and review recommendations and upcoming appts including follow up CT scan and visit with Jenny Burns, NP to discuss results. Awaiting call back.  

## 2019-07-16 IMAGING — CT CT ANGIOGRAPHY CHEST
2 of 6 series · 19 of 36 positions shown · IV contrast (APPLIED)
Comparison: 04/08/2018

CLINICAL DATA: Left-sided chest pain

EXAM:
CT ANGIOGRAPHY CHEST WITH CONTRAST
TECHNIQUE: Multidetector CT imaging of the chest was performed using the
standard protocol during bolus administration of intravenous
contrast. Multiplanar CT image reconstructions and MIPs were
obtained to evaluate the vascular anatomy.
CONTRAST:  75mL OMNIPAQUE IOHEXOL 350 MG/ML SOLN

[Series 6: thins · axial · 0.75mm/px · z∈[-595,-296]mm · 18 of 333 slices shown]
[im 17/333  lung]
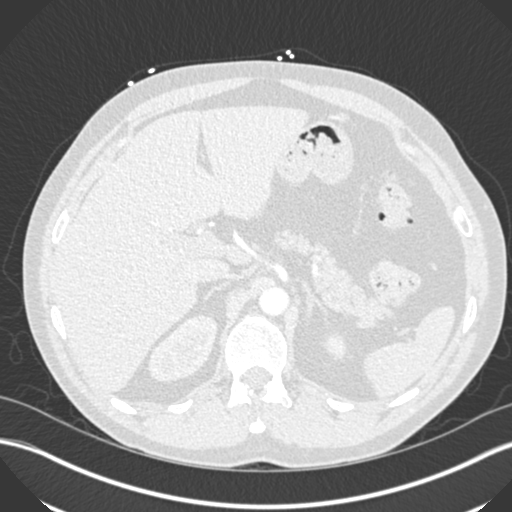
[im 34/333  mediastinal]
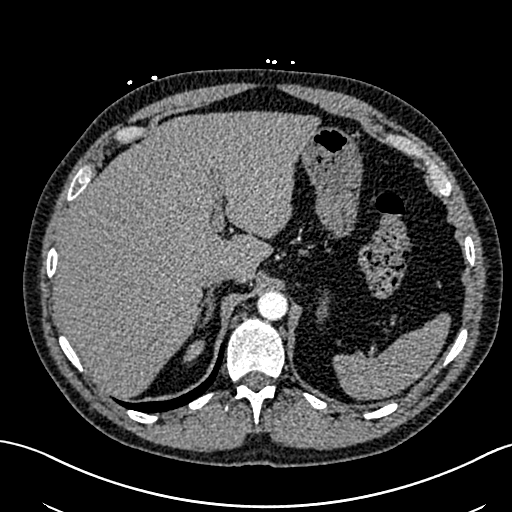
[im 50/333  lung]
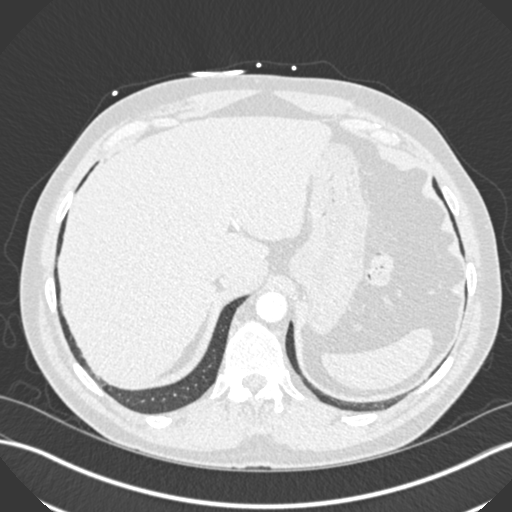
[im 67/333  mediastinal]
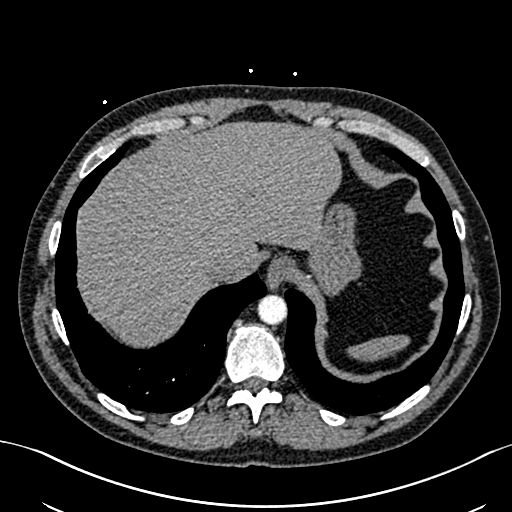
[im 84/333  lung]
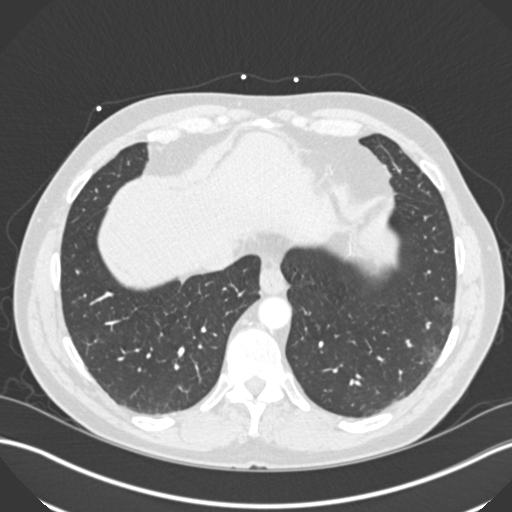
[im 100/333  mediastinal]
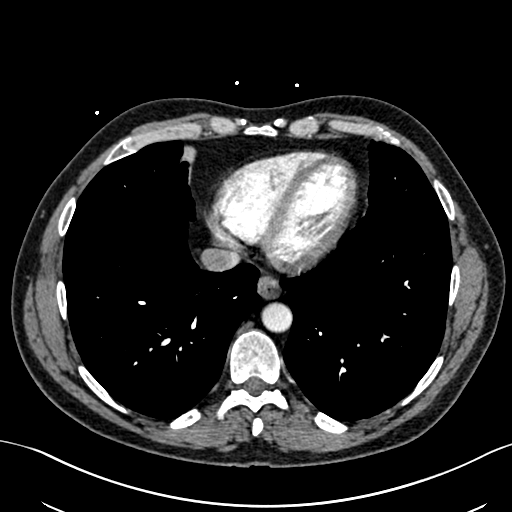
[im 117/333  lung]
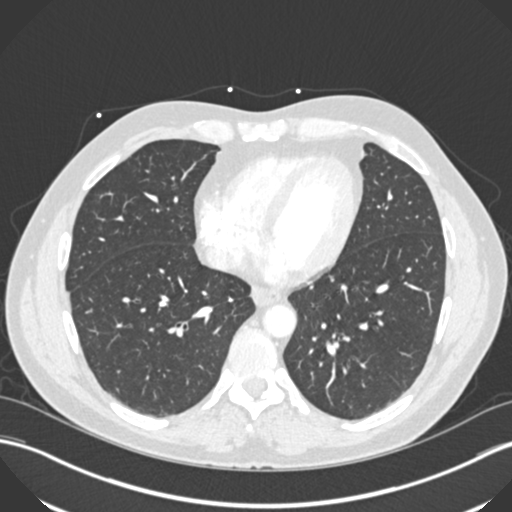
[im 133/333  mediastinal]
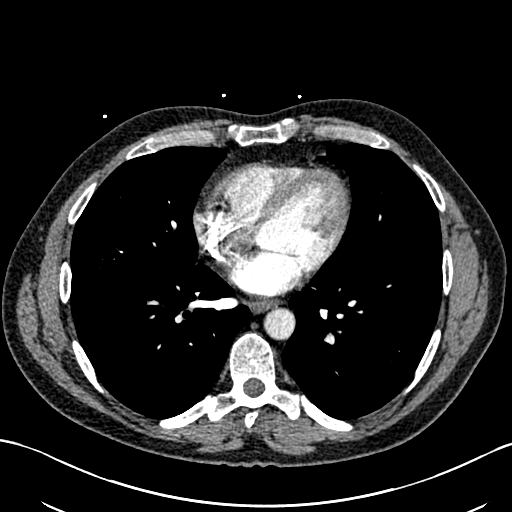
[im 150/333  lung]
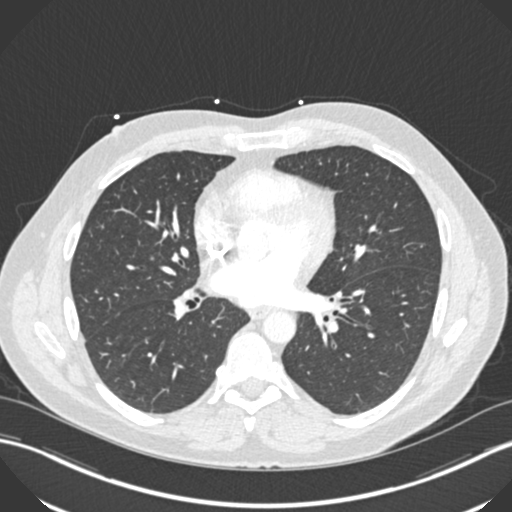
[im 183/333  mediastinal]
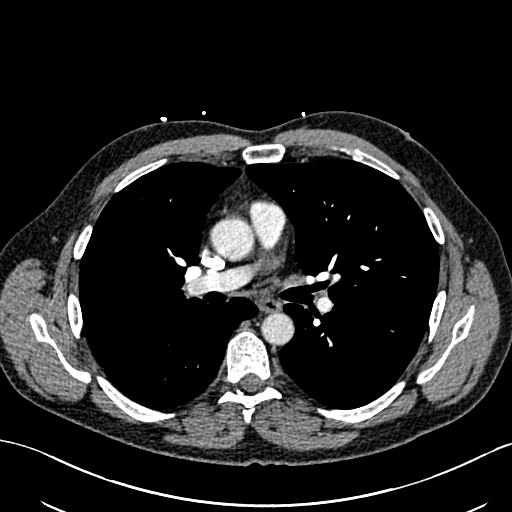
[im 200/333  lung]
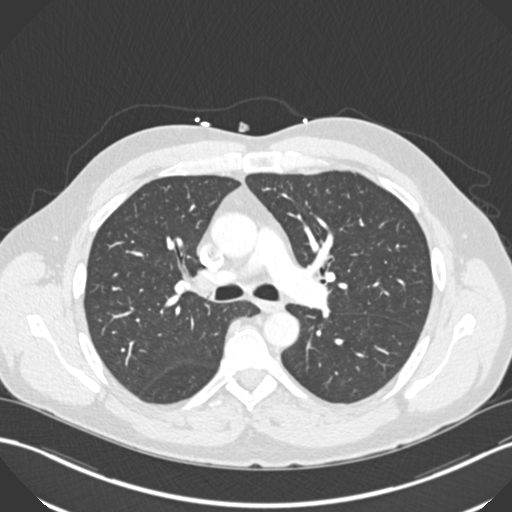
[im 216/333  mediastinal]
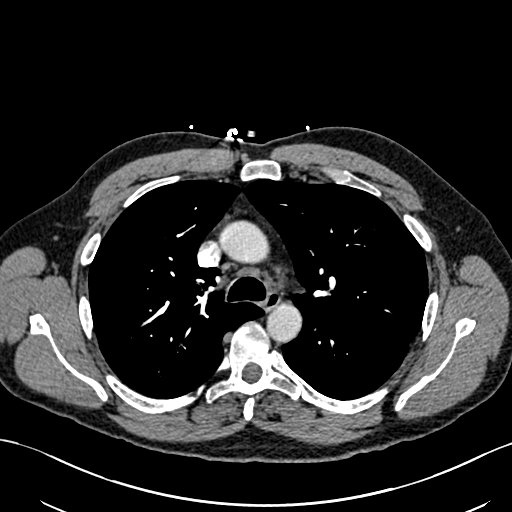
[im 233/333  lung]
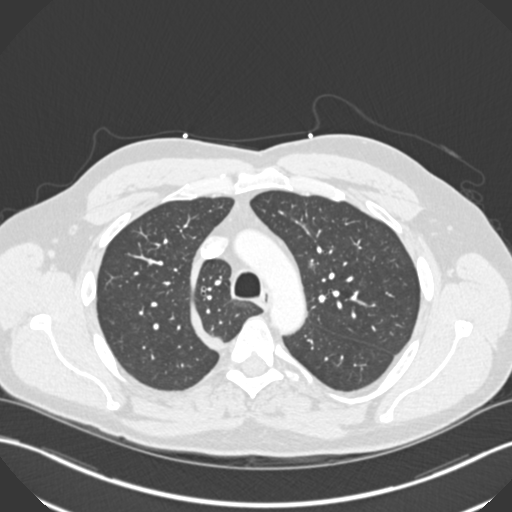
[im 250/333  mediastinal]
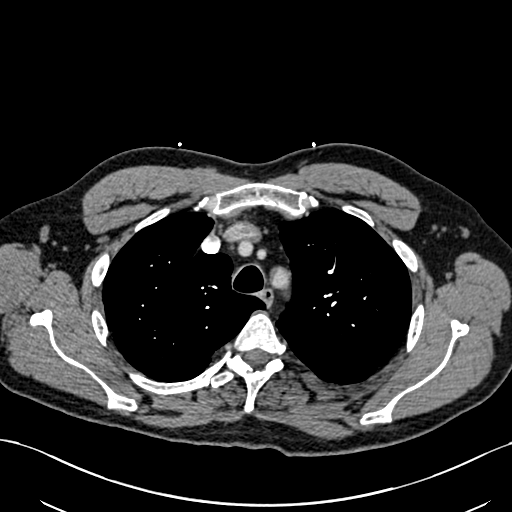
[im 266/333  lung]
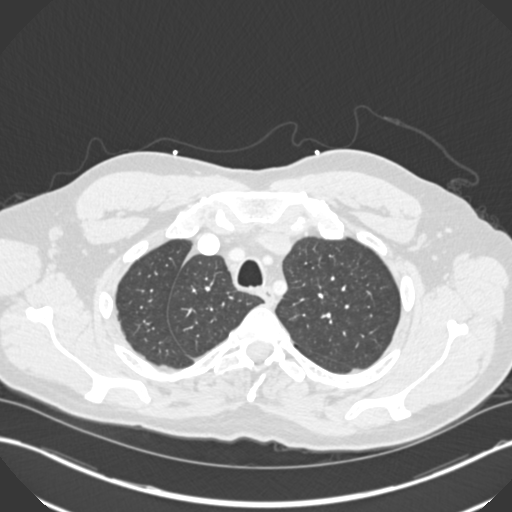
[im 283/333  mediastinal]
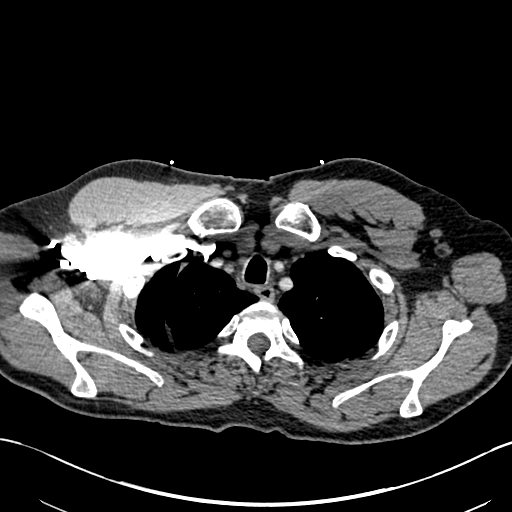
[im 299/333  lung]
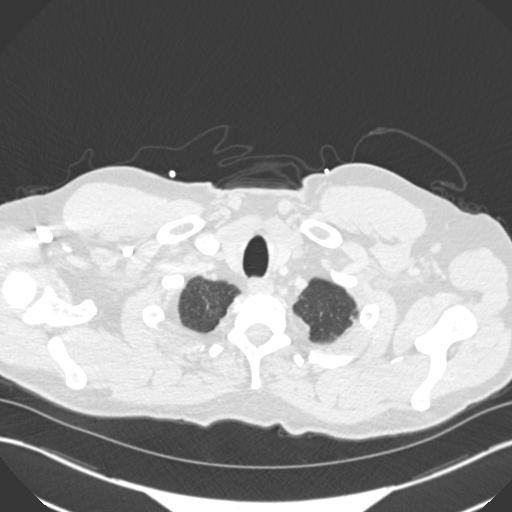
[im 316/333  mediastinal]
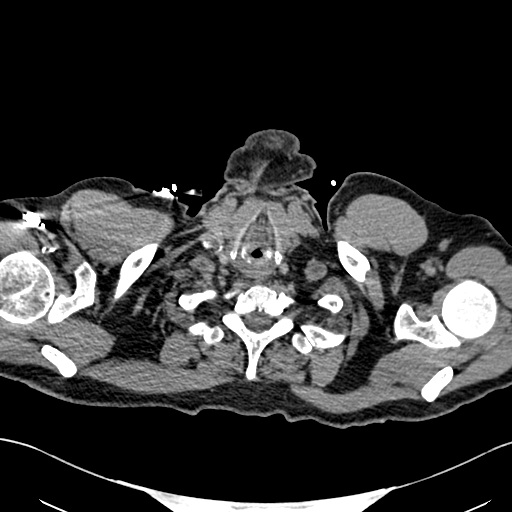

[Series 8: coronal mpr · coronal · 0.72mm/px · 1 of 87 slices shown]
[im 44/87  mediastinal]
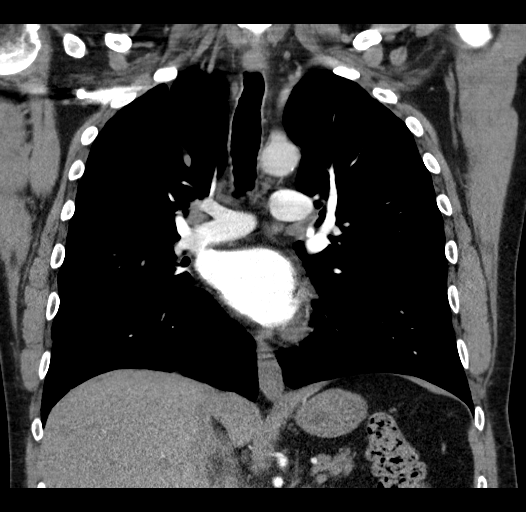

[19 of 36 positions shown; findings below may reference images not displayed]

FINDINGS: Cardiovascular: Normal heart size. No pericardial effusion.
Suboptimal but satisfactory opacification of the pulmonary arteries
to the segmental level. No pulmonary artery filling defect.

Mediastinum/Nodes: Negative for mass or worrisome lymph node.
Prominent right hilar lymph nodes remain stable.

Lungs/Pleura: Central airways are clear. Borderline generalized
airway thickening. There is a 5 x 10 mm sub solid nodule in the left
upper lobe which is increased in density since the pure ground-glass
appearance in 8636. A sub solid nodule in the left upper lobe on
[DATE] is also stable at 4 years. There are a few solid nodules that
are smaller elsewhere in the lungs, stable.

There is no edema, consolidation, effusion, or pneumothorax.

Upper Abdomen: Negative

Musculoskeletal: No acute or aggressive finding

Review of the MIP images confirms the above findings.
IMPRESSION: 1. Negative for pulmonary embolism or other acute finding.
2. 5 x 10 mm left upper lobe sub solid nodule. The nodule has become
more solid since 8636, although size is similar. Recommend six-month
follow-up chest CT without contrast.

## 2019-08-11 ENCOUNTER — Ambulatory Visit: Payer: Self-pay | Attending: Oncology

## 2019-09-17 ENCOUNTER — Other Ambulatory Visit: Payer: Self-pay | Admitting: Oncology

## 2019-10-22 ENCOUNTER — Other Ambulatory Visit: Payer: Self-pay | Admitting: Oncology

## 2019-11-03 ENCOUNTER — Telehealth: Payer: Self-pay | Admitting: *Deleted

## 2019-11-03 NOTE — Telephone Encounter (Signed)
Referral received for pt to be seen in Lung Nodule Clinic for further workup of incidental lung nodule with follow up CT scan. Left message with patient to call back to discuss clinic and review recommendations and upcoming appts including follow up CT scan and visit with Jenny Burns, NP to discuss results. Awaiting call back.  

## 2019-11-17 ENCOUNTER — Telehealth: Payer: Self-pay | Admitting: *Deleted

## 2019-11-17 NOTE — Telephone Encounter (Signed)
Pt has not returned phone calls to schedule appts in the lung nodule clinic after multiple attempts. Pt discharged from clinic. Letter faxed to PCP to refer patient back if desires further follow up in the lung nodule clinic.

## 2022-09-19 DIAGNOSIS — Z1389 Encounter for screening for other disorder: Secondary | ICD-10-CM | POA: Diagnosis not present

## 2022-09-19 DIAGNOSIS — F909 Attention-deficit hyperactivity disorder, unspecified type: Secondary | ICD-10-CM | POA: Diagnosis not present

## 2022-09-19 DIAGNOSIS — Z1159 Encounter for screening for other viral diseases: Secondary | ICD-10-CM | POA: Diagnosis not present

## 2022-09-19 DIAGNOSIS — R7309 Other abnormal glucose: Secondary | ICD-10-CM | POA: Diagnosis not present

## 2022-09-19 DIAGNOSIS — R69 Illness, unspecified: Secondary | ICD-10-CM | POA: Diagnosis not present

## 2022-09-19 DIAGNOSIS — J069 Acute upper respiratory infection, unspecified: Secondary | ICD-10-CM | POA: Diagnosis not present

## 2022-09-19 DIAGNOSIS — Z20822 Contact with and (suspected) exposure to covid-19: Secondary | ICD-10-CM | POA: Diagnosis not present

## 2022-11-14 DIAGNOSIS — Z1389 Encounter for screening for other disorder: Secondary | ICD-10-CM | POA: Diagnosis not present

## 2022-11-14 DIAGNOSIS — F1120 Opioid dependence, uncomplicated: Secondary | ICD-10-CM | POA: Diagnosis not present

## 2023-08-13 ENCOUNTER — Encounter: Payer: Self-pay | Admitting: Emergency Medicine

## 2024-03-30 ENCOUNTER — Encounter: Payer: Self-pay | Admitting: Emergency Medicine

## 2024-07-10 ENCOUNTER — Encounter: Payer: Self-pay | Admitting: Emergency Medicine

## 2024-07-10 ENCOUNTER — Emergency Department
Admission: EM | Admit: 2024-07-10 | Discharge: 2024-07-11 | Attending: Emergency Medicine | Admitting: Emergency Medicine

## 2024-07-10 DIAGNOSIS — R220 Localized swelling, mass and lump, head: Secondary | ICD-10-CM | POA: Insufficient documentation

## 2024-07-10 DIAGNOSIS — R070 Pain in throat: Secondary | ICD-10-CM | POA: Insufficient documentation

## 2024-07-10 DIAGNOSIS — Z5321 Procedure and treatment not carried out due to patient leaving prior to being seen by health care provider: Secondary | ICD-10-CM | POA: Insufficient documentation

## 2024-07-10 NOTE — ED Triage Notes (Addendum)
 Pt reports knot on back of head and having R sided throat pain made worse with chewing or swallowing. Reports it started 2- 3 weeks. He reports it does hurt but heating pad does make pain better. He is not sure if it is getting bigger.

## 2024-07-11 NOTE — ED Notes (Signed)
 Pt not seen in waiting room when went to get labs.

## 2024-07-20 NOTE — ED Provider Notes (Signed)
 [facility] Emergency Department Provider Note    ED Clinical Impression    Final diagnoses:  Neck pain (Primary)       Impression, Medical Decision Making, Progress Notes and Critical Care    Impression, Differential Diagnosis and Plan of Care   Patient is a 53 y.o. male with PMH of COPD presenting for approximately a month of burning sensation to his lower R occiput going into his neck, as well as some bilateral axilla pain and PO discomfort.   On exam, patient is well-appearing and in no acute distress. Vital signs are normotensive, non tachycardic, afebrile, and saturating 100% on room air with no signs of any respiratory distress. Physical exam is benign.  I think that is neuropathic.  Certainly does not sound acute.  Will trial patient on meloxicam and topical lidocaine patches for the next few days.  Plan for discharge with recommendations to follow up with PCP.   Portions of this record have been created using Scientist, clinical (histocompatibility and immunogenetics). Dictation errors have been sought, but may not have been identified and corrected.  See chart and resident provider documentation for details.  ____________________________________________      History     Reason for Visit Medical Problem   HPI  Jaston Daelan Gatt is a 53 y.o. male with a PMH of COPD who presents to the ED for head pain. The patient reports a burning sensation to the R side of his occiput for the past month. Around the same time frame, he has also been experiencing intermittent axilla pain provoked by movement as well as difficulty swallowing due to some throat discomfort and pressure. He has been taking tylenol  and a heating pad to manage his symptoms to moderate relief. He denies rash.   Outside Historian(s) (EMS, Significant Other, Family, Parent, Caregiver, Friend, Law Enforcement, etc.)  None  External Records Reviewed (Inpatient/Outpatient notes, Prior labs/imaging studies, Care  Everywhere, PDMP, External ED notes, etc)  Outpatient note - 01/13/2023 Internal Medicine for PMH  Past Medical History[1]  There is no problem list on file for this patient.[2]  Past Surgical History[3]  No current facility-administered medications for this encounter. No current outpatient medications on file.  Allergies Ketorolac  Family History[4]  Social History Short Social History[5]    Physical Exam   This provider entered the patient's room: Yes:  If this provider did not enter the room, a comprehensive physical exam was not able to be performed due to increased infection risk to themselves, other providers, staff and other patients), as well as to conserve personal protective equipment (PPE) utilization during the COVID-19 pandemic.  If this provider did enter the patient room, the following was PPE worn: Surgical mask, eye protection and gloves   ED Triage Vitals  Enc Vitals Group     BP 07/20/24 2212 145/93     Pulse 07/20/24 2212 86     SpO2 Pulse --      Resp 07/20/24 2212 18     Temp 07/20/24 2212 36.4 C (97.5 F)     Temp Source 07/20/24 2212 Oral     SpO2 07/20/24 2212 100 %     Weight 07/20/24 2209 72.1 kg (158 lb 14.4 oz)   Constitutional: Alert and oriented. Well appearing and in no distress. Eyes: Conjunctivae are normal. ENT      Head: Normocephalic and atraumatic.      Nose: No congestion.      Mouth/Throat: Mucous membranes are moist.  Neck: No stridor. Hematological/Lymphatic/Immunilogical: No cervical lymphadenopathy. Cardiovascular: Normal rate, regular rhythm. Normal and symmetric distal pulses are present in all extremities. Respiratory: Normal respiratory effort. Breath sounds are normal. Gastrointestinal: Soft and nontender. There is no CVA tenderness. Genitourinary: Deferred. Musculoskeletal: Normal range of motion in all extremities.      Right lower leg: No tenderness or edema.      Left lower leg: No tenderness or  edema. Neurologic: Normal speech and language. No gross focal neurologic deficits are appreciated. Skin: Skin is warm, dry and intact. No rash noted. Psychiatric: Mood and affect are normal. Speech and behavior are normal.    Radiology   N/A   Documentation assistance was provided by Venetia Kuba, Scribe, on July 20, 2024 at 10:25 PM for Morene Sa, FNP.   July 20, 2024 10:48 PM. Documentation assistance provided by the scribe. I was present during the time the encounter was recorded. The information recorded by the scribe was done at my direction and has been reviewed and validated by me.         [1] Past Medical History: Diagnosis Date   Kidney stones   [2] There is no problem list on file for this patient. [3] No past surgical history on file. [4] History reviewed. No pertinent family history. [5] Social History Tobacco Use   Smoking status: Every Day    Current packs/day: 1.00    Types: Cigarettes  Substance Use Topics   Alcohol use: Yes    Comment: occasional   Drug use: No   Sa Morene Lung, FNP 07/20/24 2248

## 2024-09-22 ENCOUNTER — Emergency Department

## 2024-09-22 ENCOUNTER — Observation Stay
Admission: EM | Admit: 2024-09-22 | Discharge: 2024-09-24 | Disposition: A | Discharge status: Home / Self Care | Source: Ambulatory Visit | Attending: Internal Medicine | Admitting: Internal Medicine

## 2024-09-22 DIAGNOSIS — F909 Attention-deficit hyperactivity disorder, unspecified type: Secondary | ICD-10-CM | POA: Diagnosis not present

## 2024-09-22 DIAGNOSIS — R4781 Slurred speech: Secondary | ICD-10-CM | POA: Diagnosis present

## 2024-09-22 DIAGNOSIS — J449 Chronic obstructive pulmonary disease, unspecified: Secondary | ICD-10-CM | POA: Diagnosis not present

## 2024-09-22 DIAGNOSIS — R634 Abnormal weight loss: Secondary | ICD-10-CM

## 2024-09-22 DIAGNOSIS — F17210 Nicotine dependence, cigarettes, uncomplicated: Secondary | ICD-10-CM | POA: Insufficient documentation

## 2024-09-22 DIAGNOSIS — Z85828 Personal history of other malignant neoplasm of skin: Secondary | ICD-10-CM | POA: Diagnosis not present

## 2024-09-22 DIAGNOSIS — F1190 Opioid use, unspecified, uncomplicated: Secondary | ICD-10-CM | POA: Insufficient documentation

## 2024-09-22 DIAGNOSIS — J101 Influenza due to other identified influenza virus with other respiratory manifestations: Secondary | ICD-10-CM

## 2024-09-22 DIAGNOSIS — R1314 Dysphagia, pharyngoesophageal phase: Secondary | ICD-10-CM | POA: Insufficient documentation

## 2024-09-22 DIAGNOSIS — J9601 Acute respiratory failure with hypoxia: Principal | ICD-10-CM | POA: Insufficient documentation

## 2024-09-22 DIAGNOSIS — E871 Hypo-osmolality and hyponatremia: Secondary | ICD-10-CM | POA: Diagnosis not present

## 2024-09-22 DIAGNOSIS — F329 Major depressive disorder, single episode, unspecified: Secondary | ICD-10-CM | POA: Insufficient documentation

## 2024-09-22 DIAGNOSIS — F1990 Other psychoactive substance use, unspecified, uncomplicated: Secondary | ICD-10-CM | POA: Diagnosis present

## 2024-09-22 DIAGNOSIS — R1319 Other dysphagia: Secondary | ICD-10-CM

## 2024-09-22 LAB — BLOOD GAS, VENOUS
Acid-Base Excess: 1.2 mmol/L (ref 0.0–2.0)
Bicarbonate: 27.6 mmol/L (ref 20.0–28.0)
O2 Saturation: 43.1 %
Patient temperature: 37
pCO2, Ven: 50 mmHg (ref 44–60)
pH, Ven: 7.35 (ref 7.25–7.43)

## 2024-09-22 LAB — RESP PANEL BY RT-PCR (RSV, FLU A&B, COVID)  RVPGX2
Influenza A by PCR: POSITIVE — AB
Influenza B by PCR: NEGATIVE
Resp Syncytial Virus by PCR: NEGATIVE
SARS Coronavirus 2 by RT PCR: NEGATIVE

## 2024-09-22 LAB — CBC WITH DIFFERENTIAL/PLATELET
Abs Immature Granulocytes: 0.03 K/uL (ref 0.00–0.07)
Basophils Absolute: 0 K/uL (ref 0.0–0.1)
Basophils Relative: 0 %
Eosinophils Absolute: 0 K/uL (ref 0.0–0.5)
Eosinophils Relative: 0 %
HCT: 32.6 % — ABNORMAL LOW (ref 39.0–52.0)
Hemoglobin: 11.1 g/dL — ABNORMAL LOW (ref 13.0–17.0)
Immature Granulocytes: 0 %
Lymphocytes Relative: 12 %
Lymphs Abs: 1 K/uL (ref 0.7–4.0)
MCH: 29.7 pg (ref 26.0–34.0)
MCHC: 34 g/dL (ref 30.0–36.0)
MCV: 87.2 fL (ref 80.0–100.0)
Monocytes Absolute: 1.2 K/uL — ABNORMAL HIGH (ref 0.1–1.0)
Monocytes Relative: 14 %
Neutro Abs: 6.4 K/uL (ref 1.7–7.7)
Neutrophils Relative %: 74 %
Platelets: 266 K/uL (ref 150–400)
RBC: 3.74 MIL/uL — ABNORMAL LOW (ref 4.22–5.81)
RDW: 12.5 % (ref 11.5–15.5)
WBC: 8.6 K/uL (ref 4.0–10.5)
nRBC: 0 % (ref 0.0–0.2)

## 2024-09-22 LAB — COMPREHENSIVE METABOLIC PANEL WITH GFR
ALT: 28 U/L (ref 0–44)
AST: 57 U/L — ABNORMAL HIGH (ref 15–41)
Albumin: 4 g/dL (ref 3.5–5.0)
Alkaline Phosphatase: 103 U/L (ref 38–126)
Anion gap: 12 (ref 5–15)
BUN: 16 mg/dL (ref 6–20)
CO2: 23 mmol/L (ref 22–32)
Calcium: 9.2 mg/dL (ref 8.9–10.3)
Chloride: 94 mmol/L — ABNORMAL LOW (ref 98–111)
Creatinine, Ser: 0.69 mg/dL (ref 0.61–1.24)
GFR, Estimated: >60 mL/min
Glucose, Bld: 172 mg/dL — ABNORMAL HIGH (ref 70–99)
Potassium: 3.8 mmol/L (ref 3.5–5.1)
Sodium: 128 mmol/L — ABNORMAL LOW (ref 135–145)
Total Bilirubin: 0.4 mg/dL (ref 0.0–1.2)
Total Protein: 7 g/dL (ref 6.5–8.1)

## 2024-09-22 LAB — LACTIC ACID, PLASMA
Lactic Acid, Venous: 0.9 mmol/L (ref 0.5–1.9)
Lactic Acid, Venous: 1.4 mmol/L (ref 0.5–1.9)

## 2024-09-22 MED ORDER — IPRATROPIUM-ALBUTEROL 0.5-2.5 (3) MG/3ML IN SOLN
3.0000 mL | Freq: Once | RESPIRATORY_TRACT | Status: AC
  Administered 2024-09-22: 3 mL via RESPIRATORY_TRACT
  Filled 2024-09-22: qty 3

## 2024-09-22 MED ORDER — ENOXAPARIN SODIUM 40 MG/0.4ML IJ SOSY: 40.0000 mg | PREFILLED_SYRINGE | INTRAMUSCULAR | Status: DC

## 2024-09-22 MED ORDER — ONDANSETRON HCL 4 MG/2ML IJ SOLN: 4.0000 mg | Freq: Four times a day (QID) | INTRAMUSCULAR | Status: DC | PRN

## 2024-09-22 MED ORDER — SODIUM CHLORIDE 0.9% FLUSH
3.0000 mL | Freq: Two times a day (BID) | INTRAVENOUS | Status: DC
  Administered 2024-09-23 – 2024-09-24 (×4): 3 mL via INTRAVENOUS

## 2024-09-22 MED ORDER — METHYLPREDNISOLONE SODIUM SUCC 125 MG IJ SOLR
125.0000 mg | Freq: Once | INTRAMUSCULAR | Status: AC
  Administered 2024-09-22: 125 mg via INTRAVENOUS
  Filled 2024-09-22: qty 2

## 2024-09-22 MED ORDER — NICOTINE 14 MG/24HR TD PT24
14.0000 mg | MEDICATED_PATCH | Freq: Every day | TRANSDERMAL | Status: DC
  Administered 2024-09-22 – 2024-09-24 (×3): 14 mg via TRANSDERMAL
  Filled 2024-09-22 (×3): qty 1

## 2024-09-22 MED ORDER — SODIUM CHLORIDE 0.9 % IV BOLUS
1000.0000 mL | Freq: Once | INTRAVENOUS | Status: AC
  Administered 2024-09-22: 1000 mL via INTRAVENOUS

## 2024-09-22 MED ORDER — SENNOSIDES-DOCUSATE SODIUM 8.6-50 MG PO TABS: 1.0000 | ORAL_TABLET | Freq: Every evening | ORAL | Status: DC | PRN

## 2024-09-22 MED ORDER — ACETAMINOPHEN 325 MG PO TABS: 650.0000 mg | ORAL_TABLET | Freq: Four times a day (QID) | ORAL | Status: DC | PRN

## 2024-09-22 MED ORDER — ONDANSETRON HCL 4 MG PO TABS: 4.0000 mg | ORAL_TABLET | Freq: Four times a day (QID) | ORAL | Status: DC | PRN

## 2024-09-22 MED ORDER — AZITHROMYCIN 250 MG PO TABS
500.0000 mg | ORAL_TABLET | Freq: Every day | ORAL | Status: AC
  Administered 2024-09-22 – 2024-09-24 (×3): 500 mg via ORAL
  Filled 2024-09-22: qty 1
  Filled 2024-09-22: qty 2
  Filled 2024-09-22: qty 1

## 2024-09-22 MED ORDER — OSELTAMIVIR PHOSPHATE 75 MG PO CAPS
75.0000 mg | ORAL_CAPSULE | Freq: Two times a day (BID) | ORAL | Status: DC
  Administered 2024-09-22 – 2024-09-24 (×4): 75 mg via ORAL
  Filled 2024-09-22 (×4): qty 1

## 2024-09-22 MED ORDER — PREDNISONE 20 MG PO TABS
40.0000 mg | ORAL_TABLET | Freq: Every day | ORAL | Status: DC
  Administered 2024-09-23 – 2024-09-24 (×2): 40 mg via ORAL
  Filled 2024-09-22 (×2): qty 2

## 2024-09-22 MED ORDER — ACETAMINOPHEN 650 MG RE SUPP: 650.0000 mg | Freq: Four times a day (QID) | RECTAL | Status: DC | PRN

## 2024-09-22 NOTE — ED Provider Triage Note (Signed)
 Emergency Medicine Provider Triage Evaluation Note  Edward [facility] , a 54 y.o. male  was evaluated in triage.  Pt complains of fever, chills feels bad. Also headaches for 8 months, weight loss.    Review of Systems  Positive: H/A, chills, fever, weight loss, concerns for lung ca, drug use Negative: No known sick exposure  Physical Exam  There were no vitals taken for this visit. Gen:   Awake, no distress  talkative, no noted sob Resp:  Normal effort  course cough, rales MSK:   Moves extremities without difficulty  Other:    Medical Decision Making  Medically screening exam initiated at 11:38 AM.  Appropriate orders placed.  Edward [facility] was informed that the remainder of the evaluation will be completed by another provider, this initial triage assessment does not replace that evaluation, and the importance of remaining in the ED until their evaluation is complete.     Saunders Shona CROME, PA-C 09/22/24 1146

## 2024-09-22 NOTE — H&P (Signed)
 " History and Physical    Edward [facility] FMW:[id number] DOB: 05-28-1971 DOA: 09/22/2024  DOS: the patient was seen and examined on 09/22/2024  PCP: Lenon Layman ORN, MD   Patient coming from: Home  I have personally briefly reviewed patient's old medical records in [facility] and CareEverywhere  HPI:   Edward [facility] is a 54 y.o. year old male with medical history of MDD, polysubstance use, COPD presenting to the ED with URI symptoms along with complaint of slurred speech that has been ongoing.  Patient is somnolent on exam but answers questions stating he is feeling well.  Has any chest pain.  Does report shortness of breath and coughing. On arrival to the ED patient was noted to be HDS stable.  Lab work and imaging obtained.  CBC without leukocytosis, mild anemia below baseline that is normocytic.  CMP with mild hyponatremia, hypochloremia and moderate hyperglycemia otherwise normal renal function and relatively normal hepatic function.  Lactic acid checked and normal.  Respiratory panel checked and positive for influenza A.  Given patient's normal status ABG ordered but not obtained.  CT head ordered and did not show any acute findings.  Chest x-ray reviewed findings.  Given patient required oxygen which is not the case at baseline, TR[facility] contacted for admission.  Review of Systems: As mentioned in the history of present illness. All other systems reviewed and are negative.   Past Medical History:  Diagnosis Date   Basal cell carcinoma of skin    Skin Cancer in [place] resected from back over 20 years ago.    No past surgical history on file.   Allergies[1]  No family history on file.  Prior to Admission medications  Medication Sig Start Date End Date Taking? Authorizing Provider  amphetamine-dextroamphetamine (ADDERALL) 20 MG tablet TAKE 1 TABLET BY MOUTH EVERY MORNING AND AT NOON 10/23/23  Yes [provider]  Buprenorphine HCl-Naloxone HCl 8-2 MG FILM Place  under the tongue. 12/19/22  Yes [provider]  DULoxetine (CYMBALTA) 60 MG capsule Take 60 mg by mouth. 01/08/22  Yes [provider]    Social History:  reports that he has been smoking. He does not have any smokeless tobacco history on file. He reports current drug use. Drugs: Cocaine and Methamphetamines. He reports that he does not drink alcohol.    Physical Exam: Vitals:   09/22/24 1137 09/22/24 1139 09/22/24 1532  BP: 126/73  114/74  Pulse: (!) 110  86  Resp: 18  17  Temp: (!) 100.9 F (38.3 C)  99.7 F (37.6 C)  TempSrc: Oral  Oral  SpO2: 91%  99%  Weight:  65.3 kg   Height:  6' 2 (1.88 m)     Gen: NAD HENT: NCAT, poor dentition CV: normal heart sounds Lung: diffuse wheezing present Abd: No TTP, normal bowel sounds MSK: No asymmetry, good bulk and tone Neuro: alert and oriented x 4, no focal deficits   Labs on Admission: I have personally reviewed following labs and imaging studies  CBC: Recent Labs  Lab 09/22/24 1150  WBC 8.6  NEUTROABS 6.4  HGB 11.1*  HCT 32.6*  MCV 87.2  PLT 266   Basic Metabolic Panel: Recent Labs  Lab 09/22/24 1150  NA 128*  K 3.8  CL 94*  CO2 23  GLUCOSE 172*  BUN 16  CREATININE 0.69  CALCIUM 9.2   GFR: Estimated Creatinine Clearance: 96.4 mL/min (by C-G formula based on SCr of 0.69 mg/dL). Liver Function Tests:  Recent Labs  Lab 09/22/24 1150  AST 57*  ALT 28  ALKPHOS 103  BILITOT 0.4  PROT 7.0  ALBUMIN 4.0   No results for input(s): LIPASE, AMYLASE in the last 168 hours. No results for input(s): AMMONIA in the last 168 hours. Coagulation Profile: No results for input(s): INR, PROTIME in the last 168 hours. Cardiac Enzymes: No results for input(s): CKTOTAL, CKMB, CKMBINDEX, TROPONINI, TROPONINIHS in the last 168 hours. BNP (last 3 results) No results for input(s): BNP in the last 8760 hours. HbA1C: No results for input(s): HGBA1C in the last 72 hours. CBG: No  results for input(s): GLUCAP in the last 168 hours. Lipid Profile: No results for input(s): CHOL, HDL, LDLCALC, TRIG, CHOLHDL, LDLDIRECT in the last 72 hours. Thyroid Function Tests: No results for input(s): TSH, T4TOTAL, FREET4, T3FREE, THYROIDAB in the last 72 hours. Anemia Panel: No results for input(s): VITAMINB12, FOLATE, FERRITIN, TIBC, IRON, RETICCTPCT in the last 72 hours. Urine analysis:    Component Value Date/Time   COLORURINE Colorless 07/24/2012 0147   APPEARANCEUR Clear 07/24/2012 0147   LABSPEC 1.002 07/24/2012 0147   PHURINE 7.0 07/24/2012 0147   GLUCOSEU Negative 07/24/2012 0147   HGBUR Negative 07/24/2012 0147   BILIRUBINUR Negative 07/24/2012 0147   KETONESUR Negative 07/24/2012 0147   PROTEINUR Negative 07/24/2012 0147   NITRITE Negative 07/24/2012 0147   LEUKOCYTESUR Trace 07/24/2012 0147    Radiological Exams on Admission: I have personally reviewed images CT Head Wo Contrast Result Date: 09/22/2024 CLINICAL DATA:  Neurologic deficit, slurred speech for 4 months EXAM: CT HEAD WITHOUT CONTRAST TECHNIQUE: Contiguous axial images were obtained from the base of the skull through the vertex without intravenous contrast. RADIATION DOSE REDUCTION: This exam was performed according to the departmental dose-optimization program which includes automated exposure control, adjustment of the mA and/or kV according to patient size and/or use of iterative reconstruction technique. COMPARISON:  07/24/2012 FINDINGS: Brain: No acute infarct or hemorrhage. Lateral ventricles and midline structures are unremarkable. No acute extra-axial fluid collections. No mass effect. Vascular: No hyperdense vessel or unexpected calcification. Skull: Normal. Negative for fracture or focal lesion. Sinuses/Orbits: Mild mucosal thickening within the ethmoid air cells. Other: None. IMPRESSION: 1. No acute intracranial process. Electronically Signed   By: Ozell Daring  M.D.   On: 09/22/2024 16:20   DG Chest 2 View Result Date: 09/22/2024 CLINICAL DATA:  Shortness of breath, fever EXAM: CHEST - 2 VIEW COMPARISON:  August 11, 2017 FINDINGS: The heart size and mediastinal contours are within normal limits. Both lungs are clear. The visualized skeletal structures are unremarkable. IMPRESSION: No active cardiopulmonary disease. Electronically Signed   By: Lynwood Landy Raddle M.D.   On: 09/22/2024 12:44    EKG: My personal interpretation of EKG shows: EKG with sinus rhythm without any acute ST changes.   Assessment/Plan Principal Problem:   Acute hypoxic respiratory failure (HCC) Active Problems:   Polysubstance use disorder   COPD (chronic obstructive pulmonary disease) (HCC)   MDD (major depressive disorder)   Hyponatremia   Patient with acute hypoxic respiratory failure that is in the setting of influenza and influenza exacerbating his COPD.  Treatment with Tamiflu  and prednisone  and azithromycin .  Will oxygen as able.  Polysubstance use disorder: Continues to use meth.  Advised against this use.  Difficult because headache and stated it can lead to headaches and stroke.  Strongly advised cessation.  Discussed his headaches will need to be evaluated once he has abstained from all illicit substances.  Further  workup can be performed by PCP.  Slurred speech: Resolved by the time of my evaluation.  Tobacco use disorder: States he is smoking 1 to 2 packs/day.  Given his COPD advised strict abstinence from all smoking.  ADHD: On Adderall by PCP.  Will defer to PCP but given his concern for weight loss this may be contributory along with his methamphetamine use.  Weight loss: Patient orts he has lost 40 pounds since last year.  He reports some odynophagia.  Given his report of odynophagia and weight loss he will benefit from GI involvement.  We will consult GI but will defer to them the timing of any endoscopic evaluation.  He reports he has not had an EGD or an  colonoscopy in his life.  Methamphetamine can be contributing to his weight loss along with Adderall but he will need routine cancer screenings.  Discussed this with the patient to follow-up with his primary care doctor regarding this.  Opioid use disorder: On Suboxone.  Will continue here  MDD: Continue home SNRI  VTE prophylaxis:  SQ Heparin   Diet: Code Status:  Full Code Telemetry:  Admission status: Observation, Telemetry bed Patient is from: Home Anticipated d/c is to: Home Anticipated d/c is in: 1-2 days   Family Communication: Updated at bedside  Consults called: None   Severity of Illness: The appropriate patient status for this patient is OBSERVATION. Observation status is judged to be reasonable and necessary in order to provide the required intensity of service to ensure the patient's safety. The patient's presenting symptoms, physical exam findings, and initial radiographic and laboratory data in the context of their medical condition is felt to place them at decreased risk for further clinical deterioration. Furthermore, it is anticipated that the patient will be medically stable for discharge from the [facility] within 2 midnights of admission.    Morene Bathe, MD [facility]     [1]  Allergies Allergen Reactions   Toradol [Ketorolac Tromethamine] Other (See Comments)    Chest pain   "

## 2024-09-22 NOTE — ED Notes (Signed)
 Pt was unable to articulate why they were in the ER to get treatment, when asked why they came to the hospital. Pt gave multiple answers that did not relate to why they were here today. Pt was also had difficult time staying awake while RN was at bedside. Pt admitted to using methamphetamines, suboxone and clonopin within the last 24 hours. Pt denies using ETOH.

## 2024-09-22 NOTE — ED Notes (Signed)
 Pt's O2 saturation dropped to 88% during triage. Pt placed on new 2L via [place]

## 2024-09-22 NOTE — ED Triage Notes (Addendum)
 Pt presents to the ED via POV from home with slurred speech x4 months. Reports fevers x3 days. Headaches x3 months. A lump on the right side of his neck with pain x1 month. Pt states that he has lost his taste. Significant weight loss in the last 5-6 months. Pt reports smoking meth a couple of days ago. Has a hx of drug use and typically does drugs once every three weeks.  Saunders, [place] evaluated at time of triage

## 2024-09-22 NOTE — ED Provider Notes (Signed)
 "  [facility] Provider Note   Event Date/Time   First MD Initiated Contact with Patient 09/22/24 1515     (approximate) History  multiple complaints  HPI Edward [facility] is a 54 y.o. male with a past medical history of COPD with continued tobacco abuse, ADD, and polysubstance abuse who presents with multiple complaints including fevers, cough, and fatigue over the last 3 days.  Patient also complains of occipital headache with slurred speech over the last 3 months.  Also states he has lost his taste over the last few days and has a small lump on the right side of his neck that is been present for the last month and is tender to palpation.  Patient states that he smoked meth a couple days ago and uses illicit drugs approximately once every 3 weeks.  Patient denies any alcohol abuse ROS: Patient currently denies any vision changes, tinnitus, difficulty speaking, facial droop, sore throat, chest pain, shortness of breath, abdominal pain, nausea/vomiting/diarrhea, dysuria, or weakness/numbness/paresthesias in any extremity   Physical Exam  Triage Vital Signs: ED Triage Vitals  Encounter Vitals Group     BP 09/22/24 1137 126/73     Girls Systolic BP Percentile --      Girls Diastolic BP Percentile --      Boys Systolic BP Percentile --      Boys Diastolic BP Percentile --      Pulse Rate 09/22/24 1137 (!) 110     Resp 09/22/24 1137 18     Temp 09/22/24 1137 (!) 100.9 F (38.3 C)     Temp Source 09/22/24 1137 Oral     SpO2 09/22/24 1137 91 %     Weight 09/22/24 1139 144 lb (65.3 kg)     Height 09/22/24 1139 6' 2 (1.88 m)     Head Circumference --      Peak Flow --      Pain Score 09/22/24 1139 3     Pain Loc --      Pain Education --      Exclude from Growth Chart --    Most recent vital signs: Vitals:   09/22/24 1137 09/22/24 1532  BP: 126/73 114/74  Pulse: (!) 110 86  Resp: 18 17  Temp: (!) 100.9 F (38.3 C) 99.7 F (37.6 C)  SpO2: 91% 99%    General: Asleep and only awoken to light touch CV:  Good peripheral perfusion. Resp:  Increased effort.  Expiratory wheezing over bilateral lung fields Abd:  No distention. Other:  Middle-aged Caucasian male resting comfortably in no acute distress ED Results / Procedures / Treatments  Labs (all labs ordered are listed, but only abnormal results are displayed) Labs Reviewed  RESP PANEL BY RT-PCR (RSV, FLU A&B, COVID)  RVPGX2 - Abnormal; Notable for the following components:      Result Value   Influenza A by PCR POSITIVE (*)    All other components within normal limits  CBC WITH DIFFERENTIAL/PLATELET - Abnormal; Notable for the following components:   RBC 3.74 (*)    Hemoglobin 11.1 (*)    HCT 32.6 (*)    Monocytes Absolute 1.2 (*)    All other components within normal limits  COMPREHENSIVE METABOLIC PANEL WITH GFR - Abnormal; Notable for the following components:   Sodium 128 (*)    Chloride 94 (*)    Glucose, Bld 172 (*)    AST 57 (*)    All other components within normal limits  LACTIC  ACID, PLASMA  LACTIC ACID, PLASMA  URINALYSIS, W/ REFLEX TO CULTURE (INFECTION SUSPECTED)  URINE DRUG SCREEN  BLOOD GAS, VENOUS   EKG ED ECG REPORT I, Artist MARLA Kerns, the attending physician, personally viewed and interpreted this ECG. Date: 09/22/2024 EKG Time: 1153 Rate: 97 Rhythm: normal sinus rhythm QRS Axis: normal Intervals: normal ST/T Wave abnormalities: normal Narrative Interpretation: no evidence of acute ischemia RADIOLOGY ED MD interpretation: 2 view chest x-ray interpreted by me shows no evidence of acute abnormalities including no pneumonia, pneumothorax, or widened mediastinum - All radiology independently interpreted and agree with radiology assessment Official radiology report(s): CT Head Wo Contrast Result Date: 09/22/2024 CLINICAL DATA:  Neurologic deficit, slurred speech for 4 months EXAM: CT HEAD WITHOUT CONTRAST TECHNIQUE: Contiguous axial images were obtained  from the base of the skull through the vertex without intravenous contrast. RADIATION DOSE REDUCTION: This exam was performed according to the departmental dose-optimization program which includes automated exposure control, adjustment of the mA and/or kV according to patient size and/or use of iterative reconstruction technique. COMPARISON:  07/24/2012 FINDINGS: Brain: No acute infarct or hemorrhage. Lateral ventricles and midline structures are unremarkable. No acute extra-axial fluid collections. No mass effect. Vascular: No hyperdense vessel or unexpected calcification. Skull: Normal. Negative for fracture or focal lesion. Sinuses/Orbits: Mild mucosal thickening within the ethmoid air cells. Other: None. IMPRESSION: 1. No acute intracranial process. Electronically Signed   By: Ozell Daring M.D.   On: 09/22/2024 16:20   DG Chest 2 View Result Date: 09/22/2024 CLINICAL DATA:  Shortness of breath, fever EXAM: CHEST - 2 VIEW COMPARISON:  August 11, 2017 FINDINGS: The heart size and mediastinal contours are within normal limits. Both lungs are clear. The visualized skeletal structures are unremarkable. IMPRESSION: No active cardiopulmonary disease. Electronically Signed   By: Lynwood Landy Raddle M.D.   On: 09/22/2024 12:44   PROCEDURES: Critical Care performed: Yes, see critical care procedure note(s) Procedures CRITICAL CARE Performed by: Bakary Bramer K Brynli Ollis  Total critical care time: 33 minutes  Critical care time was exclusive of separately billable procedures and treating other patients.  Critical care was necessary to treat or prevent imminent or life-threatening deterioration.  Critical care was time spent personally by me on the following activities: development of treatment plan with patient and/or surrogate as well as nursing, discussions with consultants, evaluation of patient's response to treatment, examination of patient, obtaining history from patient or surrogate, ordering and performing  treatments and interventions, ordering and review of laboratory studies, ordering and review of radiographic studies, pulse oximetry and re-evaluation of patient's condition.  MEDICATIONS ORDERED IN ED: Medications  sodium chloride  0.9 % bolus 1,000 mL (1,000 mLs Intravenous New Bag/Given 09/22/24 1712)  ipratropium-albuterol  (DUONEB) 0.5-2.5 (3) MG/3ML nebulizer solution 3 mL (3 mLs Nebulization Given 09/22/24 1716)  methylPREDNISolone  sodium succinate (SOLU-MEDROL ) 125 mg/2 mL injection 125 mg (125 mg Intravenous Given 09/22/24 1712)   IMPRESSION / MDM / ASSESSMENT AND PLAN / ED COURSE  I reviewed the triage vital signs and the nursing notes.                             The patient is on the cardiac monitor to evaluate for evidence of arrhythmia and/or significant heart rate changes. Patient's presentation is most consistent with acute presentation with potential threat to life or bodily function. Patient is a 54 year old male with the above-stated past medical history who presents for multiple complaints including headaches and slurred  speech for the last 4 months as well as recent history of the last 3 days of fevers, cough, fatigue DDx: Upper respiratory infection, CVA, COPD exacerbation, electrolyte abnormality Plan: CBC, CMP, lactate, urinalysis, VBG, respiratory viral panel, CT head, chest x-ray, EKG  Radiologic and laboratory evaluation shows patient positive for influenza A.  Given the mild end expiratory wheezing on exam, I am concerned that patient may have a reactive airway picture and was treated with DuoNebs and Solu-Medrol .  Given the patient is requiring supplemental oxygenation, he will require admission to the internal medicine service for further evaluation and management.  I spoke to Dr. Deretha who agrees to accept this patient onto their service for further evaluation and management.  Patient agrees to plan for admission and all questions answered  Dispo: Admit to medicine    FINAL CLINICAL IMPRESSION(S) / ED DIAGNOSES   Final diagnoses:  Acute respiratory failure with hypoxia (HCC)  Hyponatremia  Influenza A virus present   Rx / DC Orders   ED Discharge Orders     None      Note:  This document was prepared using Dragon voice recognition software and may include unintentional dictation errors.   Dwain Huhn K, MD 09/22/24 1805  "

## 2024-09-23 ENCOUNTER — Other Ambulatory Visit: Payer: Self-pay

## 2024-09-23 ENCOUNTER — Encounter: Payer: Self-pay | Admitting: Internal Medicine

## 2024-09-23 DIAGNOSIS — R1319 Other dysphagia: Secondary | ICD-10-CM | POA: Diagnosis not present

## 2024-09-23 DIAGNOSIS — J449 Chronic obstructive pulmonary disease, unspecified: Secondary | ICD-10-CM | POA: Diagnosis not present

## 2024-09-23 DIAGNOSIS — F329 Major depressive disorder, single episode, unspecified: Secondary | ICD-10-CM | POA: Diagnosis not present

## 2024-09-23 DIAGNOSIS — R634 Abnormal weight loss: Secondary | ICD-10-CM | POA: Diagnosis not present

## 2024-09-23 DIAGNOSIS — J9601 Acute respiratory failure with hypoxia: Secondary | ICD-10-CM | POA: Diagnosis not present

## 2024-09-23 DIAGNOSIS — J101 Influenza due to other identified influenza virus with other respiratory manifestations: Secondary | ICD-10-CM | POA: Diagnosis not present

## 2024-09-23 LAB — BASIC METABOLIC PANEL WITH GFR
Anion gap: 12 (ref 5–15)
BUN: 12 mg/dL (ref 6–20)
CO2: 21 mmol/L — ABNORMAL LOW (ref 22–32)
Calcium: 8.9 mg/dL (ref 8.9–10.3)
Chloride: 98 mmol/L (ref 98–111)
Creatinine, Ser: 0.56 mg/dL — ABNORMAL LOW (ref 0.61–1.24)
GFR, Estimated: >60 mL/min
Glucose, Bld: 154 mg/dL — ABNORMAL HIGH (ref 70–99)
Potassium: 4.2 mmol/L (ref 3.5–5.1)
Sodium: 131 mmol/L — ABNORMAL LOW (ref 135–145)

## 2024-09-23 LAB — URINALYSIS, W/ REFLEX TO CULTURE (INFECTION SUSPECTED)
Bacteria, UA: NONE SEEN
Bilirubin Urine: NEGATIVE
Glucose, UA: NEGATIVE mg/dL
Ketones, ur: NEGATIVE mg/dL
Leukocytes,Ua: NEGATIVE
Nitrite: NEGATIVE
Protein, ur: NEGATIVE mg/dL
RBC / HPF: 0 RBC/hpf (ref 0–5)
Specific Gravity, Urine: 1.017 (ref 1.005–1.030)
Squamous Epithelial / HPF: 0 /HPF (ref 0–5)
pH: 6 (ref 5.0–8.0)

## 2024-09-23 LAB — TSH: TSH: 1.28 u[IU]/mL (ref 0.350–4.500)

## 2024-09-23 LAB — CBC
HCT: 34.1 % — ABNORMAL LOW (ref 39.0–52.0)
Hemoglobin: 11.3 g/dL — ABNORMAL LOW (ref 13.0–17.0)
MCH: 29.8 pg (ref 26.0–34.0)
MCHC: 33.1 g/dL (ref 30.0–36.0)
MCV: 90 fL (ref 80.0–100.0)
Platelets: 254 K/uL (ref 150–400)
RBC: 3.79 MIL/uL — ABNORMAL LOW (ref 4.22–5.81)
RDW: 12.8 % (ref 11.5–15.5)
WBC: 8.9 K/uL (ref 4.0–10.5)
nRBC: 0 % (ref 0.0–0.2)

## 2024-09-23 LAB — URINE DRUG SCREEN
Amphetamines: POSITIVE — AB
Barbiturates: NEGATIVE
Benzodiazepines: NEGATIVE
Cocaine: NEGATIVE
Fentanyl: NEGATIVE
Methadone Scn, Ur: NEGATIVE
Opiates: NEGATIVE
Tetrahydrocannabinol: NEGATIVE

## 2024-09-23 LAB — HIV ANTIBODY (ROUTINE TESTING W REFLEX): HIV Screen 4th Generation wRfx: NONREACTIVE

## 2024-09-23 LAB — MAGNESIUM: Magnesium: 1.5 mg/dL — ABNORMAL LOW (ref 1.7–2.4)

## 2024-09-23 MED ORDER — HEPARIN SODIUM (PORCINE) 5000 UNIT/ML IJ SOLN
5000.0000 [IU] | Freq: Three times a day (TID) | INTRAMUSCULAR | Status: DC
  Administered 2024-09-23 – 2024-09-24 (×4): 5000 [IU] via SUBCUTANEOUS
  Filled 2024-09-23 (×4): qty 1

## 2024-09-23 MED ORDER — ENSURE PLUS HIGH PROTEIN PO LIQD
237.0000 mL | Freq: Two times a day (BID) | ORAL | Status: DC
  Administered 2024-09-23 – 2024-09-24 (×2): 237 mL via ORAL

## 2024-09-23 NOTE — Consult Note (Signed)
 "      Rogelia Copping, MD [facility]  [address], [address] [place], [place] [address] Phone: [phone] Fax : [phone]  Consultation  Referring Provider:     Dr. Fernand Primary Care Physician:  Pcp, No Primary Gastroenterologist: Sampson         Reason for Consultation:     Dysphagia  Date of Admission:  09/22/2024 Date of Consultation:  09/23/2024         HPI:   Edward Hammond is a 54 y.o. male who has a history of COPD and polysubstance abuse who reported a recent history of upper respiratory symptoms with the patient reporting that he had increasing cough and congestion.  The patient was tested positive for influenza A.  The patient had noted a 30 pound weight loss.  He has smoked meth as recently as a couple of days ago. The patient reports his swallowing issues are not really painful swallowing but more of food not going down well.  He states this has been going on for many months.  He reports that he was recently established with a new PCP but he cannot remember the PCPs name or whether he told the PCP about his trouble swallowing and weight loss issues.  The patient was diagnosed by the hospitalist with acute hypoxia respiratory failure and started on Tamiflu  prednisone  and azithromycin .  Due to his weight loss from what he reports to be 175 baseline down to 134 pounds recently a GI consult was called.  The patient has not seen GI in the past for either an EGD or colonoscopy.  Past Medical History:  Diagnosis Date   Basal cell carcinoma of skin    Skin Cancer in [place] resected from back over 20 years ago.    History reviewed. No pertinent surgical history.  Prior to Admission medications  Medication Sig Start Date End Date Taking? Authorizing Provider  amphetamine-dextroamphetamine (ADDERALL) 20 MG tablet TAKE 1 TABLET BY MOUTH EVERY MORNING AND AT NOON 10/23/23  Yes [provider]  Buprenorphine HCl-Naloxone HCl 8-2 MG FILM Place under the tongue. 12/19/22  Yes  [provider]  ADDERALL XR 30 MG 24 hr capsule Take 30 mg by mouth daily. Patient not taking: Reported on 09/22/2024 07/01/24   [provider]  DULoxetine (CYMBALTA) 60 MG capsule Take 60 mg by mouth. Patient not taking: Reported on 09/22/2024 01/08/22   [provider]    History reviewed. No pertinent family history.   Social History[1]  Allergies as of 09/22/2024 - Review Complete 09/22/2024  Allergen Reaction Noted   Toradol [ketorolac tromethamine] Other (See Comments) 08/24/2015    Review of Systems:    All systems reviewed and negative except where noted in HPI.   Physical Exam:  Vital signs in last 24 hours: Temp:  [98.2 F (36.8 C)-100.9 F (38.3 C)] 98.2 F (36.8 C) (01/15 9366) Pulse Rate:  [56-110] 71 (01/15 0633) Resp:  [12-20] 20 (01/15 0633) BP: (90-126)/(55-74) 108/55 (01/15 0633) SpO2:  [91 %-99 %] 97 % (01/15 0633) FiO2 (%):  [2 %] 2 % (01/15 0130) Weight:  [65.3 kg] 65.3 kg (01/14 1139)   General:   Pleasant, cooperative in NAD Head:  Normocephalic and atraumatic. Eyes:   No icterus.   Conjunctiva pink. PERRLA. Ears:  Normal auditory acuity. Neck:  Supple; no masses or thyroidomegaly Lungs: Diffuse rhonchi bilaterally.  Heart:  Regular rate and rhythm;  Without murmur, clicks, rubs or gallops Abdomen:  Soft, nondistended, nontender. Normal bowel  sounds. No appreciable masses or hepatomegaly.  No rebound or guarding.  Rectal:  Not performed. Msk:  Symmetrical without gross deformities.   Extremities:  Without edema, cyanosis or clubbing. Neurologic:  Alert and oriented x3;  grossly normal neurologically. Skin:  Intact without significant lesions or rashes. Cervical Nodes:  No significant cervical adenopathy. Psych:  Alert and cooperative. Normal affect.  LAB RESULTS: Recent Labs    09/22/24 1150 09/23/24 0618  WBC 8.6 8.9  HGB 11.1* 11.3*  HCT 32.6* 34.1*  PLT 266 254   BMET Recent Labs    09/22/24 1150  09/23/24 0618  NA 128* 131*  K 3.8 4.2  CL 94* 98  CO2 23 21*  GLUCOSE 172* 154*  BUN 16 12  CREATININE 0.69 0.56*  CALCIUM 9.2 8.9   LFT Recent Labs    09/22/24 1150  PROT 7.0  ALBUMIN 4.0  AST 57*  ALT 28  ALKPHOS 103  BILITOT 0.4   PT/INR No results for input(s): LABPROT, INR in the last 72 hours.  STUDIES: CT Head Wo Contrast Result Date: 09/22/2024 CLINICAL DATA:  Neurologic deficit, slurred speech for 4 months EXAM: CT HEAD WITHOUT CONTRAST TECHNIQUE: Contiguous axial images were obtained from the base of the skull through the vertex without intravenous contrast. RADIATION DOSE REDUCTION: This exam was performed according to the departmental dose-optimization program which includes automated exposure control, adjustment of the mA and/or kV according to patient size and/or use of iterative reconstruction technique. COMPARISON:  07/24/2012 FINDINGS: Brain: No acute infarct or hemorrhage. Lateral ventricles and midline structures are unremarkable. No acute extra-axial fluid collections. No mass effect. Vascular: No hyperdense vessel or unexpected calcification. Skull: Normal. Negative for fracture or focal lesion. Sinuses/Orbits: Mild mucosal thickening within the ethmoid air cells. Other: None. IMPRESSION: 1. No acute intracranial process. Electronically Signed   By: Ozell Daring M.D.   On: 09/22/2024 16:20   DG Chest 2 View Result Date: 09/22/2024 CLINICAL DATA:  Shortness of breath, fever EXAM: CHEST - 2 VIEW COMPARISON:  August 11, 2017 FINDINGS: The heart size and mediastinal contours are within normal limits. Both lungs are clear. The visualized skeletal structures are unremarkable. IMPRESSION: No active cardiopulmonary disease. Electronically Signed   By: Lynwood Landy Raddle M.D.   On: 09/22/2024 12:44      Impression / Plan:   Assessment: Principal Problem:   Acute hypoxic respiratory failure (HCC) Active Problems:   Polysubstance use disorder   COPD (chronic  obstructive pulmonary disease) (HCC)   MDD (major depressive disorder)   Hyponatremia   Edward Hammond is a 54 y.o. y/o male with acute respiratory issues with improvement overnight but the patient was found to have influenza A.  The patient has had a 30 pound weight loss with dysphagia for the last 5 months.  I am now being consulted for this patient's weight loss and dysphagia.  Plan:  I do not believe that this patient is a candidate for a GI procedure at this time with his acute respiratory infection and shortness of breath requiring admission to the hospital.  This appears to be a chronic issue and although needs to be evaluated I do not believe that this should be done in the patient's present condition.  The patient can follow-up for an EGD and possible colonoscopy since he has not had a screening colonoscopy as an outpatient or if stabilized and appropriate for a EGD prior to discharge if can be reconsidered at that time.  Thank you for  involving me in the care of this patient.      LOS: 0 days   Rogelia Copping, MD, MD. NOLIA 09/23/2024, 7:54 AM,  Pager [phone] 7am-5pm  Check AMION for 5pm -7am coverage and on weekends   Note: This dictation was prepared with Dragon dictation along with smaller phrase technology. Any transcriptional errors that result from this process are unintentional.       [1]  Social History Tobacco Use   Smoking status: Every Day  Substance Use Topics   Alcohol use: No   Drug use: Yes    Types: Cocaine, Methamphetamines   "

## 2024-09-23 NOTE — Progress Notes (Signed)
 [facility]  - [place] [facility]   PATIENT NAME: Edward Hammond    MR#:  [id number]  DATE OF BIRTH:  [birth year]  SUBJECTIVE:  wife at bedside. Patient came in with increasing shortness of breath cough and congestion found to have flu A. Vitals remains stable. Blood pressure was bit soft now improved. No fever. Planes of dysphagia and weight loss. Has been happening for four months. Has set up appointment with G.I. as outpatient.    VITALS:  Blood pressure 104/72, pulse 74, temperature 97.7 F (36.5 C), temperature source Oral, resp. rate 20, height 6' 2 (1.88 m), weight 65 kg, SpO2 95%.  PHYSICAL EXAMINATION:   GENERAL:  54 y.o.-year-old patient with no acute distress.  LUNGS: Normal breath sounds bilaterally, no wheezing CARDIOVASCULAR: S1, S2 normal. No murmur   ABDOMEN: Soft, nontender, nondistended. Bowel sounds present.  EXTREMITIES: No  edema b/l.    NEUROLOGIC: nonfocal  patient is alert and awake SKIN: No obvious rash, lesion, or ulcer.   LABORATORY PANEL:  CBC Recent Labs  Lab 09/23/24 0618  WBC 8.9  HGB 11.3*  HCT 34.1*  PLT 254    Chemistries  Recent Labs  Lab 09/22/24 1150 09/23/24 0618  NA 128* 131*  K 3.8 4.2  CL 94* 98  CO2 23 21*  GLUCOSE 172* 154*  BUN 16 12  CREATININE 0.69 0.56*  CALCIUM 9.2 8.9  MG 1.5*  --   AST 57*  --   ALT 28  --   ALKPHOS 103  --   BILITOT 0.4  --    Cardiac Enzymes No results for input(s): TROPONINI in the last 168 hours. RADIOLOGY:  CT Head Wo Contrast Result Date: 09/22/2024 CLINICAL DATA:  Neurologic deficit, slurred speech for 4 months EXAM: CT HEAD WITHOUT CONTRAST TECHNIQUE: Contiguous axial images were obtained from the base of the skull through the vertex without intravenous contrast. RADIATION DOSE REDUCTION: This exam was performed according to the departmental dose-optimization program which includes automated exposure control, adjustment of the mA and/or kV according to patient  size and/or use of iterative reconstruction technique. COMPARISON:  07/24/2012 FINDINGS: Brain: No acute infarct or hemorrhage. Lateral ventricles and midline structures are unremarkable. No acute extra-axial fluid collections. No mass effect. Vascular: No hyperdense vessel or unexpected calcification. Skull: Normal. Negative for fracture or focal lesion. Sinuses/Orbits: Mild mucosal thickening within the ethmoid air cells. Other: None. IMPRESSION: 1. No acute intracranial process. Electronically Signed   By: Ozell Daring M.D.   On: 09/22/2024 16:20   DG Chest 2 View Result Date: 09/22/2024 CLINICAL DATA:  Shortness of breath, fever EXAM: CHEST - 2 VIEW COMPARISON:  August 11, 2017 FINDINGS: The heart size and mediastinal contours are within normal limits. Both lungs are clear. The visualized skeletal structures are unremarkable. IMPRESSION: No active cardiopulmonary disease. Electronically Signed   By: Lynwood Landy Raddle M.D.   On: 09/22/2024 12:44    Assessment and Plan Edward Hammond is a 54 y.o. year old male with medical history of MDD, polysubstance use, COPD presenting to the ED with URI symptoms  Respiratory panel checked and positive for influenza A   Acute hypoxic respiratory failure in the setting of influenza A with history of COPD and ongoing tobacco abuse -- hypoxia resolved. Patient sats are stable on room air -- continue Tamiflu  and empiric Zithromax  -- Solu-Medrol -- now change to prednisone - --PRN nebulizer  Tobacco abuse -- counseled on smoking cessation. Nicotine  patch  Dysphagia with weight loss --  patient was seen by Dr. Jinny G.I. in the setting of influenza and respiratory failure he recommends outpatient follow-up. Patient and wife aware  ADHD: On Adderall by PCP.  Will defer to PCP but given his concern for weight loss this may be contributory along with his methamphetamine use.  Opioid use disorder: On Suboxone.  Will continue here   MDD: Continue home  SNRI  Procedures: Family communication : wife at bedside Consults : none CODE STATUS: full DVT Prophylaxis : heparin  Level of care: Telemetry Status is: Observation The patient remains OBS appropriate and will d/c before 2 midnights.    TOTAL TIME TAKING CARE OF THIS PATIENT: 35 minutes.  >50% time spent on counselling and coordination of care  Note: This dictation was prepared with Dragon dictation along with smaller phrase technology. Any transcriptional errors that result from this process are unintentional.  Leita Blanch M.D    [facility]   CC: Primary care physician; Pcp, No

## 2024-09-23 NOTE — Evaluation (Signed)
 Physical Therapy Evaluation Patient Details Name: RAYMEL CULL MRN: [id number] DOB: 04/22/1971 Today's Date: 09/23/2024  History of Present Illness  Pt is a 54 y.o. year old male with medical history of MDD, polysubstance use, COPD presenting to the ED with URI symptoms along with complaint of slurred speech that has been ongoing. MD assessment includes: acute hypoxic respiratory failure that is in the setting of influenza and influenza exacerbating his COPD, slurred speech (resolved), opiod use disorder, and polysubstance use disorder.   Clinical Impression  Pt was pleasant and motivated to participate during the session and put forth good effort throughout. Pt found in supine on room air with SpO2 89%.  Upon coming to sitting and throughout the rest of the session SpO2 remained in the mid to upper 90s including during amb, nsg/MD notified.  Pt demonstrated good functional strength and stability throughout the session with no instability/LOB including during dynamic gait activities without an AD.  Pt reported feeling very close to his physical baseline with no PT needs identified.  Will complete PT orders at this time but will reassess pt pending a change in status upon receipt of new PT orders.          If plan is discharge home, recommend the following: Assist for transportation   Can travel by private vehicle        Equipment Recommendations None recommended by PT  Recommendations for Other Services       Functional Status Assessment Patient has not had a recent decline in their functional status     Precautions / Restrictions Precautions Precautions: Fall Restrictions Weight Bearing Restrictions Per Provider Order: No      Mobility  Bed Mobility Overal bed mobility: Independent             General bed mobility comments: Good speed and control    Transfers Overall transfer level: Independent Equipment used: None               General transfer comment:  Good eccentric and concentric control and stability    Ambulation/Gait Ambulation/Gait assistance: Independent Gait Distance (Feet): 200 Feet Assistive device: None Gait Pattern/deviations: WFL(Within Functional Limits) Gait velocity: WFL     General Gait Details: Good stability without and AD including during rapid 180 deg turns and start/stops  Stairs            Wheelchair Mobility     Tilt Bed    Modified Rankin (Stroke Patients Only)       Balance Overall balance assessment: No apparent balance deficits (not formally assessed)                                           Pertinent Vitals/Pain Pain Assessment Pain Assessment: No/denies pain    Home Living Family/patient expects to be discharged to:: Private residence Living Arrangements: Spouse/significant other (girlfriend) Available Help at Discharge: Friend(s) Type of Home: House Home Access: Stairs to enter Entrance Stairs-Rails: Right;Left;Can reach both Secretary/administrator of Steps: 2   Home Layout: One level Home Equipment: Grab bars - tub/shower      Prior Function Prior Level of Function : Independent/Modified Independent             Mobility Comments: Ind amb community distances without an AD, no falls ADLs Comments: Ind with ADLs     Extremity/Trunk Assessment   Upper Extremity Assessment Upper Extremity  Assessment: Overall WFL for tasks assessed    Lower Extremity Assessment Lower Extremity Assessment: Overall WFL for tasks assessed       Communication   Communication Communication: No apparent difficulties    Cognition Arousal: Alert Behavior During Therapy: WFL for tasks assessed/performed   PT - Cognitive impairments: No apparent impairments                         Following commands: Intact       Cueing Cueing Techniques: Verbal cues     General Comments      Exercises     Assessment/Plan    PT Assessment Patient does not  need any further PT services  PT Problem List         PT Treatment Interventions      PT Goals (Current goals can be found in the Care Plan section)  Acute Rehab PT Goals PT Goal Formulation: All assessment and education complete, DC therapy    Frequency       Co-evaluation               AM-PAC PT 6 Clicks Mobility  Outcome Measure Help needed turning from your back to your side while in a flat bed without using bedrails?: None Help needed moving from lying on your back to sitting on the side of a flat bed without using bedrails?: None Help needed moving to and from a bed to a chair (including a wheelchair)?: None Help needed standing up from a chair using your arms (e.g., wheelchair or bedside chair)?: None Help needed to walk in hospital room?: None Help needed climbing 3-5 steps with a railing? : None 6 Click Score: 24    End of Session Equipment Utilized During Treatment: Gait belt Activity Tolerance: Patient tolerated treatment well Patient left: in bed;with call bell/phone within reach Nurse Communication: Mobility status;Other (comment) (SpO2 per above) PT Visit Diagnosis: Muscle weakness (generalized) (M62.81)    Time: 9140-9085 PT Time Calculation (min) (ACUTE ONLY): 15 min   Charges:   PT Evaluation $PT Eval Low Complexity: 1 Low   PT General Charges $$ ACUTE PT VISIT: 1 Visit    D. Scott Tranae [place] PT, DPT 09/23/24, 10:13 AM

## 2024-09-24 DIAGNOSIS — J9601 Acute respiratory failure with hypoxia: Secondary | ICD-10-CM | POA: Diagnosis not present

## 2024-09-24 DIAGNOSIS — F329 Major depressive disorder, single episode, unspecified: Secondary | ICD-10-CM

## 2024-09-24 DIAGNOSIS — J101 Influenza due to other identified influenza virus with other respiratory manifestations: Secondary | ICD-10-CM

## 2024-09-24 DIAGNOSIS — J449 Chronic obstructive pulmonary disease, unspecified: Secondary | ICD-10-CM

## 2024-09-24 LAB — GLUCOSE, CAPILLARY: Glucose-Capillary: 119 mg/dL — ABNORMAL HIGH (ref 70–99)

## 2024-09-24 MED ORDER — PREDNISONE 20 MG PO TABS: 40.0000 mg | ORAL_TABLET | Freq: Every day | ORAL | 0 refills | Status: AC

## 2024-09-24 MED ORDER — NICOTINE 14 MG/24HR TD PT24: 14.0000 mg | MEDICATED_PATCH | Freq: Every day | TRANSDERMAL | 0 refills | Status: AC

## 2024-09-24 MED ORDER — OSELTAMIVIR PHOSPHATE 75 MG PO CAPS: 75.0000 mg | ORAL_CAPSULE | Freq: Two times a day (BID) | ORAL | 0 refills | Status: AC

## 2024-09-24 MED ORDER — ENSURE PLUS HIGH PROTEIN PO LIQD: 237.0000 mL | Freq: Two times a day (BID) | ORAL | 0 refills | Status: AC

## 2024-09-24 NOTE — Progress Notes (Signed)
 Pt given discharge instructions. Verbalizes understanding to take medications and follow up with PCP. IV removed, tip intact. Transfer to discharge lounge to await ride.

## 2024-09-24 NOTE — Discharge Summary (Signed)
 " Physician Discharge Summary   Patient: Edward Hammond MRN: [id number] DOB: September 11, 1970  Admit date:     09/22/2024  Discharge date: {dischdate:26783}  Discharge Physician: Leita Blanch   PCP: Pcp, No   Recommendations at discharge:  {Tip this will not be part of the note when signed- Example include specific recommendations for outpatient follow-up, pending tests to follow-up on. (Optional):26781}  ***  Discharge Diagnoses: Principal Problem:   Acute hypoxic respiratory failure (HCC) Active Problems:   Polysubstance use disorder   COPD (chronic obstructive pulmonary disease) (HCC)   MDD (major depressive disorder)   Hyponatremia   Esophageal dysphagia   Loss of weight  Resolved Problems:   * No resolved hospital problems. [facility] Course: No notes on file  Assessment and Plan: No notes have been filed under this hospital service. Service: Hospitalist     {Tip this will not be part of the note when signed Body mass index is 18.82 kg/m. , ,  (Optional):26781}  {(NOTE) Pain control PDMP Statment (Optional):26782} Consultants: *** Procedures performed: ***  Disposition: {Plan; Disposition:26390} Diet recommendation:  {Diet_Plan:26776} DISCHARGE MEDICATION: Allergies as of 09/24/2024       Reactions   Toradol [ketorolac Tromethamine] Other (See Comments)   Chest pain        Medication List     STOP taking these medications    DULoxetine 60 MG capsule Commonly known as: CYMBALTA       TAKE these medications    amphetamine-dextroamphetamine 20 MG tablet Commonly known as: ADDERALL TAKE 1 TABLET BY MOUTH EVERY MORNING AND AT NOON What changed: Another medication with the same name was removed. Continue taking this medication, and follow the directions you see here.   Buprenorphine HCl-Naloxone HCl 8-2 MG Film Place under the tongue.   feeding supplement Liqd Take 237 mLs by mouth 2 (two) times daily between meals.   nicotine  14 mg/24hr  patch Commonly known as: NICODERM CQ  - dosed in mg/24 hours Place 1 patch (14 mg total) onto the skin daily. Start taking on: September 25, 2024   oseltamivir  75 MG capsule Commonly known as: TAMIFLU  Take 1 capsule (75 mg total) by mouth 2 (two) times daily for 6 doses.   predniSONE  20 MG tablet Commonly known as: DELTASONE  Take 2 tablets (40 mg total) by mouth daily with breakfast for 4 days. Start taking on: September 25, 2024        Discharge Exam: Fredricka Weights   09/22/24 1139 09/23/24 1127 09/24/24 0452  Weight: 65.3 kg 65 kg 66.5 kg   ***  Condition at discharge: {DC Condition:26389}  The results of significant diagnostics from this hospitalization (including imaging, microbiology, ancillary and laboratory) are listed below for reference.   Imaging Studies: CT Head Wo Contrast Result Date: 09/22/2024 CLINICAL DATA:  Neurologic deficit, slurred speech for 4 months EXAM: CT HEAD WITHOUT CONTRAST TECHNIQUE: Contiguous axial images were obtained from the base of the skull through the vertex without intravenous contrast. RADIATION DOSE REDUCTION: This exam was performed according to the departmental dose-optimization program which includes automated exposure control, adjustment of the mA and/or kV according to patient size and/or use of iterative reconstruction technique. COMPARISON:  07/24/2012 FINDINGS: Brain: No acute infarct or hemorrhage. Lateral ventricles and midline structures are unremarkable. No acute extra-axial fluid collections. No mass effect. Vascular: No hyperdense vessel or unexpected calcification. Skull: Normal. Negative for fracture or focal lesion. Sinuses/Orbits: Mild mucosal thickening within the ethmoid air cells. Other: None. IMPRESSION: 1. No acute  intracranial process. Electronically Signed   By: Ozell Daring M.D.   On: 09/22/2024 16:20   DG Chest 2 View Result Date: 09/22/2024 CLINICAL DATA:  Shortness of breath, fever EXAM: CHEST - 2 VIEW COMPARISON:   August 11, 2017 FINDINGS: The heart size and mediastinal contours are within normal limits. Both lungs are clear. The visualized skeletal structures are unremarkable. IMPRESSION: No active cardiopulmonary disease. Electronically Signed   By: Lynwood Landy Raddle M.D.   On: 09/22/2024 12:44    Microbiology: Results for orders placed or performed during the hospital encounter of 09/22/24  Resp panel by RT-PCR (RSV, Flu A&B, Covid) Anterior Nasal Swab     Status: Abnormal   Collection Time: 09/22/24 11:42 AM   Specimen: Anterior Nasal Swab  Result Value Ref Range Status   SARS Coronavirus 2 by RT PCR NEGATIVE NEGATIVE Final    Comment: (NOTE) SARS-CoV-2 target nucleic acids are NOT DETECTED.  The SARS-CoV-2 RNA is generally detectable in upper respiratory specimens during the acute phase of infection. The lowest concentration of SARS-CoV-2 viral copies this assay can detect is 138 copies/mL. A negative result does not preclude SARS-Cov-2 infection and should not be used as the sole basis for treatment or other patient management decisions. A negative result may occur with  improper specimen collection/handling, submission of specimen other than nasopharyngeal swab, presence of viral mutation(s) within the areas targeted by this assay, and inadequate number of viral copies(<138 copies/mL). A negative result must be combined with clinical observations, patient history, and epidemiological information. The expected result is Negative.  Fact Sheet for Patients:  bloggercourse.com  Fact Sheet for Healthcare Providers:  seriousbroker.it  This test is no t yet approved or cleared by the [place]  FDA and  has been authorized for detection and/or diagnosis of SARS-CoV-2 by FDA under an Emergency Use Authorization (EUA). This EUA will remain  in effect (meaning this test can be used) for the duration of the COVID-19 declaration under Section  564(b)(1) of the Act, 21 U.S.C.section 360bbb-3(b)(1), unless the authorization is terminated  or revoked sooner.       Influenza A by PCR POSITIVE (A) NEGATIVE Final   Influenza B by PCR NEGATIVE NEGATIVE Final    Comment: (NOTE) The Xpert Xpress SARS-CoV-2/FLU/RSV plus assay is intended as an aid in the diagnosis of influenza from Nasopharyngeal swab specimens and should not be used as a sole basis for treatment. Nasal washings and aspirates are unacceptable for Xpert Xpress SARS-CoV-2/FLU/RSV testing.  Fact Sheet for Patients: bloggercourse.com  Fact Sheet for Healthcare Providers: seriousbroker.it  This test is not yet approved or cleared by the [place]  FDA and has been authorized for detection and/or diagnosis of SARS-CoV-2 by FDA under an Emergency Use Authorization (EUA). This EUA will remain in effect (meaning this test can be used) for the duration of the COVID-19 declaration under Section 564(b)(1) of the Act, 21 U.S.C. section 360bbb-3(b)(1), unless the authorization is terminated or revoked.     Resp Syncytial Virus by PCR NEGATIVE NEGATIVE Final    Comment: (NOTE) Fact Sheet for Patients: bloggercourse.com  Fact Sheet for Healthcare Providers: seriousbroker.it  This test is not yet approved or cleared by the [place]  FDA and has been authorized for detection and/or diagnosis of SARS-CoV-2 by FDA under an Emergency Use Authorization (EUA). This EUA will remain in effect (meaning this test can be used) for the duration of the COVID-19 declaration under Section 564(b)(1) of the Act, 21 U.S.C. section 360bbb-3(b)(1), unless the  authorization is terminated or revoked.  Performed at [facility], [address] [place], [place] [address]     Labs: CBC: Recent Labs  Lab 09/22/24 1150 09/23/24 0618  WBC 8.6 8.9  NEUTROABS 6.4  --   HGB  11.1* 11.3*  HCT 32.6* 34.1*  MCV 87.2 90.0  PLT 266 254   Basic Metabolic Panel: Recent Labs  Lab 09/22/24 1150 09/23/24 0618  NA 128* 131*  K 3.8 4.2  CL 94* 98  CO2 23 21*  GLUCOSE 172* 154*  BUN 16 12  CREATININE 0.69 0.56*  CALCIUM 9.2 8.9  MG 1.5*  --    Liver Function Tests: Recent Labs  Lab 09/22/24 1150  AST 57*  ALT 28  ALKPHOS 103  BILITOT 0.4  PROT 7.0  ALBUMIN 4.0   CBG: Recent Labs  Lab 09/24/24 0819  GLUCAP 119*    Discharge time spent: {LESS THAN/GREATER THAN:26388} 30 minutes.  Signed: Leita Blanch, MD [facility] 09/24/2024 "

## 2024-09-24 NOTE — Discharge Instructions (Addendum)
 Advise smoking cessation F/u with the NEW PCP that you have appt with

## 2024-09-25 DIAGNOSIS — J101 Influenza due to other identified influenza virus with other respiratory manifestations: Secondary | ICD-10-CM
